# Patient Record
Sex: Male | Born: 1980 | Race: White | Hispanic: No | Marital: Single | Smoking: Current every day smoker
Health system: Southern US, Community
[De-identification: ages and names within clinical notes are randomized; demographics above are authoritative.]

## PROBLEM LIST (undated history)

## (undated) DIAGNOSIS — I4891 Unspecified atrial fibrillation: Secondary | ICD-10-CM

## (undated) DIAGNOSIS — H543 Unqualified visual loss, both eyes: Secondary | ICD-10-CM

## (undated) DIAGNOSIS — I10 Essential (primary) hypertension: Secondary | ICD-10-CM

## (undated) DIAGNOSIS — N289 Disorder of kidney and ureter, unspecified: Secondary | ICD-10-CM

## (undated) HISTORY — PX: DIALYSIS FISTULA CREATION: SHX611

## (undated) HISTORY — PX: EYE SURGERY: SHX253

---

## 2007-03-04 ENCOUNTER — Ambulatory Visit (HOSPITAL_COMMUNITY): Admission: RE | Admit: 2007-03-04 | Discharge: 2007-03-05 | Payer: Self-pay | Admitting: Ophthalmology

## 2007-05-09 ENCOUNTER — Ambulatory Visit (HOSPITAL_COMMUNITY): Admission: RE | Admit: 2007-05-09 | Discharge: 2007-05-10 | Payer: Self-pay | Admitting: Ophthalmology

## 2010-09-19 NOTE — Op Note (Signed)
NAMENAVID, LENZEN                 ACCOUNT NO.:  [id number]   MEDICAL RECORD NO.:  [id number]          PATIENT TYPE:  OIB   LOCATION:  5123                         FACILITY:  [facility]   PHYSICIAN:  Chalmers Guest, M.D.     DATE OF BIRTH:  [birth year]   DATE OF PROCEDURE:  05/09/2007  DATE OF DISCHARGE:                               OPERATIVE REPORT   PREOPERATIVE DIAGNOSIS:  Neovascular glaucoma, right eye.   POSTOPERATIVE DIAGNOSIS:  Neovascular glaucoma with preexisting scar  from previous retinal surgery including pars plana vitrectomy.   ANESTHESIA:  General.   PROCEDURE:  Ahmed tube shunt with mitomycin C with Tutoplast graft.   PROCEDURE IN DETAIL:  After induction of general anesthesia, the  patient's face was prepped and draped in the usual sterile fashion.  Following this, a 6-0 nylon suture was passed through the clear cornea  to infraduct the eye.  It was noted the superior conjunctiva was  scarred, both superonasal and temporal.  A forceps and sharp Westcott  scissors were used to incise the conjunctiva at the limbus.  Blunt  scissor was then used to perform dissection and formation of a fornix  based conjunctival flap.  Bleeding was controlled with cautery. After  the conjunctiva had been recessed approximately 9-10 mm posteriorly and  all bleeders had been controlled, a tenectomy was performed.  The  previous sclerostomy suture was intact and was not removed.  Following  this, the Ahmed was taken from the package and irrigated. It was an  [employer], lot [id number], SN number [id number].  The lens was placed  aside and then mitomycin C was placed on a Gelfoam sponge.  The  mitomycin C was 0.4 mg/mL.  The Gelfoam sponge was placed under the  conjunctiva and on the eye in the fornix for two minutes.  It was then  removed and irrigated with 60 mL of balanced salt solution.  Following  this, the Ahmed plate was then primed after adequate flow of fluid was  seen coming through  the Ahmed plate beyond it was then placed using the  Time forceps and Bishop forceps with the eyelets 8 mm posterior to the  corneal scleral limbus.  A 6-0 Mersilene suture was passed through the  eyelets and it was sutured to the sclera 8 mm posterior to the limbus.  Following this, the tube was trimmed beveled up.  The paracentesis track  was formed at the 9 o'clock position and Provisc was injected in the  eye.  Following this, a 23 gauge needle was used superiorly at the  limbus to enter the anterior chamber.  Additional Provisc was injected.  A Time forceps and the Ahmed tube inserter were then used to insert the  tube into the anterior chamber without touching the cornea or the iris.  Following this, a 9-0 nylon was used to suture the tube to the sclera.  Following this, Tutoplast which was used as an allograft, reference  number [id number], was soaked and then cut in half and then fashioned over  the tube.  Four interrupted 9-0 nylon  sutures were used to suture the  Tutoplast to the sclera.  Following this, the conjunctiva was then  sutured to the cornea using a 6-0 Vicryl suture. After achieving a  watertight closure, BSS was injected in the anterior chamber and the  viscoelastic was burped out of the eye.  The anterior chamber remained  formed.  There was a small amount of blood clot noted around the tube.  Following this,  fluorescein was applied to the incision and it was Seidel negative.  5  mg of Kenalog was injected subconjunctivally at the 6 o'clock position.  Topical TobraDex was applied to the eye.  A patch and Fox shield were  placed and the patient returned to the recovery area in stable  condition.      Chalmers Guest, M.D.  Electronically Signed     RW/MEDQ  D:  05/09/2007  T:  05/09/2007  Job:  [id number]

## 2010-09-19 NOTE — Op Note (Signed)
NAMEJANSEL, VONSTEIN                 ACCOUNT NO.:  [id number]   MEDICAL RECORD NO.:  [id number]          PATIENT TYPE:  OIB   LOCATION:  5729                         FACILITY:  [facility]   PHYSICIAN:  Beulah Gandy. Ashley Royalty, M.D. DATE OF BIRTH:  December 16, 1980   DATE OF PROCEDURE:  03/04/2007  DATE OF DISCHARGE:                               OPERATIVE REPORT   ADMISSION DIAGNOSES:  1. Traction retinal detachment.  2. Proliferative diabetic retinopathy.  3. Posterior epiretinal membranes.  4. Vitreous hemorrhage.  All on the right eye.   PROCEDURES:  1. Pars plana vitrectomy.  2. Repair of traction retinal detachment.  3. Panretinal photocoagulation.  4. Membrane peel.  5. Endodiathermy and endolaser.  All in the right eye.   SURGEON:  Beulah Gandy. Ashley Royalty, M.D.   ASSISTANT:  Bryan Lemma. Lundquist, P.A.   ANESTHESIA:  General.   DETAILS:  Usual prep and drape.  Peritomies at 10, 2 and 4 o'clock, and  infusion at 4 o'clock.  The lighted pick and cutter were placed at 10  and 2 o'clock respectively.  Pars plana vitrectomy was begun in the mid  vitreous, where a young white vitreous was encountered along with clumps  of red blood.  There was severe traction at the vitreous base, as well  as the macular region.  The entire macular region was detached, based on  traction.  The vitrectomy was carried out to the periphery, where the  anterior-posterior traction was relieved with vitreous cutting and MPC  scissors.  Scleral depression was used to gain access to the vitreous  base and trim it.  The vitrectomy was carried posteriorly, where a large  mound of neovascular tissue was causing traction retinal detachment.  This area of adhesion was lifted with the lighted pick, and incised with  MPC scissors.  Endocautery was used to contract and cauterize vascular  tissue.  The tissue was peeled from its attachment to the disk and  peeled off of the macular region.  These inferior and superior arcades  were  peeled and allowed to lie back down.  All areas of traction were  relieved and the retina was allowed to reattach.  The vitrectomy was  carried superiorly, where additional areas of neovascularization were  seen.  These were trimmed, cauterized and removed with the vitreous  cutter.  Once the entire retinal surface was cleaned,  the endolaser was  positioned in the eye.  Then 584 burns were placed around the retinal  periphery at the power of 1000 milliwatts, 1000 microns each and 0.1  seconds each.  A washout procedure was performed.  The instruments were  removed from the eye and 9-0 nylon was used to close the sclerotomy  sites.  The wounds were tested and found to be tight.  The conjunctiva  was closed with wet-field cautery.  Polymyxin and gentamicin were  irrigated into Tenon space.  Atropine solution was applied.  Marcaine  was injected around the globe for postoperative pain.  Decadron 10 mg  was injected into the lower subconjunctival space.  Closing pressure was  15 with  a Barraquer tonometer.   COMPLICATIONS:  None.   DURATION:  1 hour.   TobraDex ophthalmic ointment, a patch and shield were placed.  The  patient was awakened, taken to recovery in satisfactory condition.      Beulah Gandy. Ashley Royalty, M.D.  Electronically Signed     JDM/MEDQ  D:  03/04/2007  T:  03/04/2007  Job:  [id number]

## 2011-02-09 LAB — CBC
HCT: 41.6
MCHC: 35.1
MCV: 84.2

## 2011-02-09 LAB — BASIC METABOLIC PANEL
BUN: 26 — ABNORMAL HIGH
Calcium: 9.1
Chloride: 110
Creatinine, Ser: 2.12 — ABNORMAL HIGH
GFR calc non Af Amer: 38 — ABNORMAL LOW
Glucose, Bld: 66 — ABNORMAL LOW

## 2011-02-14 LAB — CBC
HCT: 39.4
Hemoglobin: 13.7
MCHC: 34.7
RBC: 4.64

## 2011-02-14 LAB — BASIC METABOLIC PANEL
BUN: 21
CO2: 23
Calcium: 8.7
Creatinine, Ser: 2.08 — ABNORMAL HIGH
GFR calc non Af Amer: 39 — ABNORMAL LOW

## 2012-08-16 ENCOUNTER — Inpatient Hospital Stay (HOSPITAL_COMMUNITY)
Admission: EM | Admit: 2012-08-16 | Discharge: 2012-08-18 | DRG: 811 | Disposition: A | Discharge status: Home / Self Care | Payer: Medicare Other | Attending: Internal Medicine | Admitting: Internal Medicine

## 2012-08-16 ENCOUNTER — Encounter (HOSPITAL_COMMUNITY): Payer: Self-pay | Admitting: *Deleted

## 2012-08-16 DIAGNOSIS — Z8249 Family history of ischemic heart disease and other diseases of the circulatory system: Secondary | ICD-10-CM

## 2012-08-16 DIAGNOSIS — N186 End stage renal disease: Secondary | ICD-10-CM | POA: Diagnosis present

## 2012-08-16 DIAGNOSIS — Z833 Family history of diabetes mellitus: Secondary | ICD-10-CM

## 2012-08-16 DIAGNOSIS — E11319 Type 2 diabetes mellitus with unspecified diabetic retinopathy without macular edema: Secondary | ICD-10-CM | POA: Diagnosis present

## 2012-08-16 DIAGNOSIS — I120 Hypertensive chronic kidney disease with stage 5 chronic kidney disease or end stage renal disease: Secondary | ICD-10-CM | POA: Diagnosis present

## 2012-08-16 DIAGNOSIS — Z7682 Awaiting organ transplant status: Secondary | ICD-10-CM

## 2012-08-16 DIAGNOSIS — N2581 Secondary hyperparathyroidism of renal origin: Secondary | ICD-10-CM | POA: Diagnosis present

## 2012-08-16 DIAGNOSIS — Z79899 Other long term (current) drug therapy: Secondary | ICD-10-CM

## 2012-08-16 DIAGNOSIS — E669 Obesity, unspecified: Secondary | ICD-10-CM | POA: Diagnosis present

## 2012-08-16 DIAGNOSIS — H543 Unqualified visual loss, both eyes: Secondary | ICD-10-CM | POA: Diagnosis present

## 2012-08-16 DIAGNOSIS — Z992 Dependence on renal dialysis: Secondary | ICD-10-CM

## 2012-08-16 DIAGNOSIS — Z6835 Body mass index (BMI) 35.0-35.9, adult: Secondary | ICD-10-CM

## 2012-08-16 DIAGNOSIS — E1139 Type 2 diabetes mellitus with other diabetic ophthalmic complication: Secondary | ICD-10-CM | POA: Diagnosis present

## 2012-08-16 DIAGNOSIS — E1149 Type 2 diabetes mellitus with other diabetic neurological complication: Secondary | ICD-10-CM | POA: Diagnosis present

## 2012-08-16 DIAGNOSIS — I10 Essential (primary) hypertension: Secondary | ICD-10-CM

## 2012-08-16 DIAGNOSIS — E872 Acidosis, unspecified: Secondary | ICD-10-CM | POA: Diagnosis present

## 2012-08-16 DIAGNOSIS — R809 Proteinuria, unspecified: Secondary | ICD-10-CM | POA: Diagnosis present

## 2012-08-16 DIAGNOSIS — E119 Type 2 diabetes mellitus without complications: Secondary | ICD-10-CM | POA: Diagnosis present

## 2012-08-16 DIAGNOSIS — E785 Hyperlipidemia, unspecified: Secondary | ICD-10-CM | POA: Diagnosis present

## 2012-08-16 DIAGNOSIS — D631 Anemia in chronic kidney disease: Principal | ICD-10-CM | POA: Diagnosis present

## 2012-08-16 DIAGNOSIS — E1142 Type 2 diabetes mellitus with diabetic polyneuropathy: Secondary | ICD-10-CM | POA: Diagnosis present

## 2012-08-16 DIAGNOSIS — F17200 Nicotine dependence, unspecified, uncomplicated: Secondary | ICD-10-CM | POA: Diagnosis present

## 2012-08-16 DIAGNOSIS — E7800 Pure hypercholesterolemia, unspecified: Secondary | ICD-10-CM | POA: Diagnosis present

## 2012-08-16 DIAGNOSIS — D649 Anemia, unspecified: Secondary | ICD-10-CM | POA: Diagnosis present

## 2012-08-16 HISTORY — DX: Essential (primary) hypertension: I10

## 2012-08-16 HISTORY — DX: Disorder of kidney and ureter, unspecified: N28.9

## 2012-08-16 HISTORY — DX: Unqualified visual loss, both eyes: H54.3

## 2012-08-16 LAB — CBC WITH DIFFERENTIAL/PLATELET
Basophils Absolute: 0 10*3/uL (ref 0.0–0.1)
Basophils Relative: 0 % (ref 0–1)
HCT: 18.9 % — ABNORMAL LOW (ref 39.0–52.0)
Hemoglobin: 6.8 g/dL — CL (ref 13.0–17.0)
Monocytes Absolute: 0.5 10*3/uL (ref 0.1–1.0)
Neutrophils Relative %: 80 % — ABNORMAL HIGH (ref 43–77)
Platelets: 156 10*3/uL (ref 150–400)
RBC: 2.3 MIL/uL — ABNORMAL LOW (ref 4.22–5.81)

## 2012-08-16 LAB — FERRITIN: Ferritin: 304 ng/mL (ref 22–322)

## 2012-08-16 LAB — IRON AND TIBC
Saturation Ratios: 22 % (ref 20–55)
TIBC: 283 ug/dL (ref 215–435)

## 2012-08-16 LAB — COMPREHENSIVE METABOLIC PANEL
BUN: 144 mg/dL — ABNORMAL HIGH (ref 6–23)
Chloride: 96 mEq/L (ref 96–112)
Creatinine, Ser: 16.06 mg/dL — ABNORMAL HIGH (ref 0.50–1.35)
GFR calc Af Amer: 4 mL/min — ABNORMAL LOW (ref >90)
Glucose, Bld: 119 mg/dL — ABNORMAL HIGH (ref 70–99)
Total Bilirubin: 0.1 mg/dL — ABNORMAL LOW (ref 0.3–1.2)

## 2012-08-16 LAB — GLUCOSE, CAPILLARY: Glucose-Capillary: 107 mg/dL — ABNORMAL HIGH (ref 70–99)

## 2012-08-16 LAB — URINE MICROSCOPIC-ADD ON

## 2012-08-16 LAB — URINALYSIS, ROUTINE W REFLEX MICROSCOPIC
Glucose, UA: 100 mg/dL — AB
Ketones, ur: NEGATIVE mg/dL
Protein, ur: 100 mg/dL — AB

## 2012-08-16 LAB — RETICULOCYTES: Retic Ct Pct: 2.5 % (ref 0.4–3.1)

## 2012-08-16 LAB — PROTIME-INR: INR: 0.95 (ref 0.00–1.49)

## 2012-08-16 LAB — HEPATITIS B CORE ANTIBODY, TOTAL: Hep B Core Total Ab: NEGATIVE

## 2012-08-16 LAB — MAGNESIUM: Magnesium: 3 mg/dL — ABNORMAL HIGH (ref 1.5–2.5)

## 2012-08-16 LAB — ALT: ALT: 29 U/L (ref 0–53)

## 2012-08-16 LAB — TSH: TSH: 1.128 u[IU]/mL (ref 0.350–4.500)

## 2012-08-16 LAB — HEPATITIS B CORE ANTIBODY, IGM: Hep B C IgM: NEGATIVE

## 2012-08-16 MED ORDER — SODIUM CHLORIDE 0.9 % IV SOLN: 100.0000 mL | INTRAVENOUS | Status: DC | PRN

## 2012-08-16 MED ORDER — PENTAFLUOROPROP-TETRAFLUOROETH EX AERO
1.0000 "application " | INHALATION_SPRAY | CUTANEOUS | Status: DC | PRN
  Filled 2012-08-16: qty 103.5

## 2012-08-16 MED ORDER — INSULIN ASPART 100 UNIT/ML ~~LOC~~ SOLN
0.0000 [IU] | Freq: Three times a day (TID) | SUBCUTANEOUS | Status: DC
  Administered 2012-08-16 – 2012-08-18 (×3): 1 [IU] via SUBCUTANEOUS

## 2012-08-16 MED ORDER — LORAZEPAM 0.5 MG PO TABS
0.5000 mg | ORAL_TABLET | Freq: Once | ORAL | Status: AC
  Administered 2012-08-16: 0.5 mg via ORAL
  Filled 2012-08-16: qty 1

## 2012-08-16 MED ORDER — LANTHANUM CARBONATE 500 MG PO CHEW
1000.0000 mg | CHEWABLE_TABLET | Freq: Three times a day (TID) | ORAL | Status: DC
  Administered 2012-08-16 – 2012-08-18 (×4): 1000 mg via ORAL
  Filled 2012-08-16 (×14): qty 2

## 2012-08-16 MED ORDER — LIDOCAINE-PRILOCAINE 2.5-2.5 % EX CREA: 1.0000 "application " | TOPICAL_CREAM | CUTANEOUS | Status: DC | PRN

## 2012-08-16 MED ORDER — LORATADINE 10 MG PO TABS
10.0000 mg | ORAL_TABLET | Freq: Every day | ORAL | Status: DC
  Administered 2012-08-18: 10 mg via ORAL
  Filled 2012-08-16 (×3): qty 1

## 2012-08-16 MED ORDER — METOPROLOL SUCCINATE ER 50 MG PO TB24
100.0000 mg | ORAL_TABLET | Freq: Two times a day (BID) | ORAL | Status: DC
  Administered 2012-08-16 – 2012-08-18 (×4): 100 mg via ORAL
  Filled 2012-08-16 (×5): qty 2

## 2012-08-16 MED ORDER — CALCITRIOL 0.25 MCG PO CAPS
0.2500 ug | ORAL_CAPSULE | Freq: Every day | ORAL | Status: DC
  Administered 2012-08-16 – 2012-08-18 (×3): 0.25 ug via ORAL
  Filled 2012-08-16 (×3): qty 1

## 2012-08-16 MED ORDER — HEPARIN SODIUM (PORCINE) 1000 UNIT/ML DIALYSIS
1000.0000 [IU] | INTRAMUSCULAR | Status: DC | PRN
  Filled 2012-08-16: qty 1

## 2012-08-16 MED ORDER — VITAMIN D (ERGOCALCIFEROL) 1.25 MG (50000 UNIT) PO CAPS
50000.0000 [IU] | ORAL_CAPSULE | ORAL | Status: DC
  Administered 2012-08-18: 50000 [IU] via ORAL
  Filled 2012-08-16: qty 1

## 2012-08-16 MED ORDER — ONDANSETRON HCL 4 MG/2ML IJ SOLN
4.0000 mg | INTRAMUSCULAR | Status: DC | PRN
  Filled 2012-08-16: qty 2

## 2012-08-16 MED ORDER — SODIUM POLYSTYRENE SULFONATE 15 GM/60ML PO SUSP
15.0000 g | ORAL | Status: DC
  Administered 2012-08-18: 15 g via ORAL
  Filled 2012-08-16 (×2): qty 60

## 2012-08-16 MED ORDER — SODIUM BICARBONATE 650 MG PO TABS
325.0000 mg | ORAL_TABLET | Freq: Four times a day (QID) | ORAL | Status: DC
  Administered 2012-08-16 – 2012-08-18 (×7): 325 mg via ORAL
  Filled 2012-08-16 (×5): qty 1
  Filled 2012-08-16: qty 2
  Filled 2012-08-16: qty 1

## 2012-08-16 MED ORDER — TRAZODONE HCL 50 MG PO TABS
25.0000 mg | ORAL_TABLET | Freq: Every evening | ORAL | Status: DC | PRN
  Filled 2012-08-16: qty 1

## 2012-08-16 MED ORDER — SIMVASTATIN 20 MG PO TABS
20.0000 mg | ORAL_TABLET | Freq: Every day | ORAL | Status: DC
  Administered 2012-08-16 – 2012-08-17 (×2): 20 mg via ORAL
  Filled 2012-08-16 (×3): qty 1

## 2012-08-16 MED ORDER — SODIUM CHLORIDE 0.9 % IV BOLUS (SEPSIS)
500.0000 mL | Freq: Once | INTRAVENOUS | Status: AC
  Administered 2012-08-16: 500 mL via INTRAVENOUS

## 2012-08-16 MED ORDER — EPOETIN ALFA 10000 UNIT/ML IJ SOLN
10000.0000 [IU] | INTRAMUSCULAR | Status: DC
  Filled 2012-08-16: qty 1

## 2012-08-16 MED ORDER — ACETAMINOPHEN 325 MG PO TABS
650.0000 mg | ORAL_TABLET | Freq: Once | ORAL | Status: DC
  Filled 2012-08-16: qty 2

## 2012-08-16 MED ORDER — GUAIFENESIN ER 600 MG PO TB12
1200.0000 mg | ORAL_TABLET | Freq: Two times a day (BID) | ORAL | Status: DC | PRN
  Filled 2012-08-16: qty 2

## 2012-08-16 MED ORDER — FLUTICASONE PROPIONATE 50 MCG/ACT NA SUSP
2.0000 | Freq: Every day | NASAL | Status: DC
  Filled 2012-08-16: qty 16

## 2012-08-16 MED ORDER — LISINOPRIL 10 MG PO TABS
40.0000 mg | ORAL_TABLET | Freq: Every day | ORAL | Status: DC
  Administered 2012-08-16 – 2012-08-18 (×3): 40 mg via ORAL
  Filled 2012-08-16 (×3): qty 4

## 2012-08-16 MED ORDER — LOSARTAN POTASSIUM 50 MG PO TABS
100.0000 mg | ORAL_TABLET | Freq: Every day | ORAL | Status: DC
  Administered 2012-08-16 – 2012-08-17 (×2): 100 mg via ORAL
  Filled 2012-08-16 (×3): qty 2

## 2012-08-16 MED ORDER — NEPRO/CARBSTEADY PO LIQD
237.0000 mL | ORAL | Status: DC | PRN
  Filled 2012-08-16: qty 237

## 2012-08-16 MED ORDER — SODIUM CHLORIDE 0.9 % IJ SOLN
3.0000 mL | Freq: Two times a day (BID) | INTRAMUSCULAR | Status: DC
  Administered 2012-08-16 – 2012-08-17 (×4): 3 mL via INTRAVENOUS

## 2012-08-16 MED ORDER — ALTEPLASE 2 MG IJ SOLR
2.0000 mg | Freq: Once | INTRAMUSCULAR | Status: DC | PRN
  Filled 2012-08-16: qty 2

## 2012-08-16 MED ORDER — HYDRALAZINE HCL 25 MG PO TABS
50.0000 mg | ORAL_TABLET | Freq: Two times a day (BID) | ORAL | Status: DC
  Administered 2012-08-16 – 2012-08-18 (×5): 50 mg via ORAL
  Filled 2012-08-16 (×5): qty 2

## 2012-08-16 MED ORDER — HEPARIN SODIUM (PORCINE) 5000 UNIT/ML IJ SOLN
5000.0000 [IU] | Freq: Three times a day (TID) | INTRAMUSCULAR | Status: DC
  Administered 2012-08-16 – 2012-08-18 (×7): 5000 [IU] via SUBCUTANEOUS
  Filled 2012-08-16 (×7): qty 1

## 2012-08-16 MED ORDER — LIDOCAINE HCL (PF) 1 % IJ SOLN
5.0000 mL | INTRAMUSCULAR | Status: DC | PRN
  Filled 2012-08-16: qty 5

## 2012-08-16 MED ORDER — INSULIN ASPART 100 UNIT/ML ~~LOC~~ SOLN: 0.0000 [IU] | Freq: Every day | SUBCUTANEOUS | Status: DC

## 2012-08-16 MED ORDER — AMLODIPINE BESYLATE 5 MG PO TABS
5.0000 mg | ORAL_TABLET | Freq: Two times a day (BID) | ORAL | Status: DC
  Administered 2012-08-16 – 2012-08-18 (×5): 5 mg via ORAL
  Filled 2012-08-16 (×5): qty 1

## 2012-08-16 NOTE — H&P (Signed)
[facility] History and Physical  Justin Ortega  ZOX:[id number]  DOB: 21-May-1980   DOA: 08/16/2012  Late Documentation: Admission H&P completed at 6AM   PCP:   Josue Hector, MD  Nephrologist: Dr. Fausto Skillern  Chief Complaint:  Abnormal lab results  HPI: Justin Ortega is an 32 y.o. male.  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 Fausto Skillern but defaulted after December when his Creatinine was found to be >8. He returned for follow up yesterday and last night was called by his nephrologist and told to go to the ED for abnormal labs.  In the ED his Hb was 6.8, BUN 144, Creat 16, K 4.9, CO2 16.  He reports progressive fatigue over the past weeks, and is unable to state clearly why he has not been seen since December, but does report that his father "Was killed by dialysis, he had a heart attack on dialysis" He denies ever having had a biopsy and denies knowledge of the cause of the kidney failure, neither his nor his father's.  He no longer needs medication to control his blood sugar, and he seesm to believe thi means he nolonger has diabetes.  Although both parents had DM, HTN and ESRD, he has 2 sisters reportedly in perfect health; he has no brothers.  In order for him to get the transplant his girlfriend, who is the kidney donor, needs to lose another 10lbs, and he needs to quit smoking.    Apart from fatigue is only acute symptoms aresinus congestion a persistrent cough for 4 days.  He reports adequate urination and frequency, Day/Night 5-6/2-3  He has a mature dialysis fistula left upper extremity.  Rewiew of Systems:   All systems negative except as marked bold or noted in the HPI;  Constitutional:    malaise, fever and chills. ;  Eyes:   eye pain, redness and discharge. ; blind x 6 years ENMT:   ear pain, hoarseness,  nasal congestion, sinus pressure and sore throat. ;  Cardiovascular:    chest pain, palpitations, diaphoresis, dyspnea and peripheral edema.  Respiratory:   cough, hemoptysis, wheezing and stridor. ;  Gastrointestinal:  nausea, vomiting, diarrhea, constipation, abdominal pain, melena, blood in stool, hematemesis, jaundice and rectal bleeding. unusual weight loss..   Genitourinary:    frequency, dysuria, incontinence,flank pain and hematuria; Musculoskeletal:   back pain and neck pain.  swelling and trauma.;  Skin: .  pruritus, rash, abrasions, bruising and skin lesion.; ulcerations Neuro:    headache, lightheadedness and neck stiffness.  weakness, altered level of consciousness, altered mental status, extremity weakness, burning feet, involuntary movement, seizure and syncope.  Psych:    anxiety, depression, insomnia, tearfulness, panic attacks, hallucinations, paranoia, suicidal or homicidal ideation    Past Medical History  Diagnosis Date  . Renal disorder   . Blind in both eyes   . Hypertension     Past Surgical History  Procedure Laterality Date  . Eye surgery    . Dialysis fistula creation      Medications:  HOME MEDS: Prior to Admission medications   Medication Sig Start Date End Date Taking? Authorizing Provider  acetaminophen (TYLENOL) 500 MG tablet Take 500 mg by mouth every 6 (six) hours as needed for pain.   Yes Historical Provider, MD  amLODipine (NORVASC) 5 MG tablet Take 5 mg by mouth 2 (two) times daily.  Yes Historical Provider, MD  calcitRIOL (ROCALTROL) 0.25 MCG capsule Take 0.25 mcg by mouth daily.   Yes Historical Provider, MD  diphenhydrAMINE (BENADRYL) 25 mg capsule Take 25 mg by mouth every 6 (six) hours as needed for allergies.   Yes Historical Provider, MD  hydrALAZINE (APRESOLINE) 50 MG tablet Take 50 mg by mouth 2 (two) times daily.   Yes Historical Provider, MD  lanthanum (FOSRENOL) 500 MG chewable tablet Chew 1,000 mg by mouth. Take 2 tabs w/ every meal  & 1 w/ every snack.   Yes Historical Provider, MD  lisinopril (PRINIVIL,ZESTRIL) 40 MG tablet Take 40 mg by mouth daily.   Yes Historical Provider, MD  losartan (COZAAR) 100 MG tablet Take 100 mg by mouth daily.   Yes Historical Provider, MD  metoprolol succinate (TOPROL-XL) 100 MG 24 hr tablet Take 100 mg by mouth 2 (two) times daily. Take with or immediately following a meal.   Yes Historical Provider, MD  pravastatin (PRAVACHOL) 40 MG tablet Take 40 mg by mouth daily.   Yes Historical Provider, MD  sodium bicarbonate 325 MG tablet Take by mouth 4 (four) times daily. 10gr tab   Yes Historical Provider, MD  sodium bicarbonate 650 MG tablet Take 650 mg by mouth 2 (two) times daily.   Yes Historical Provider, MD  sodium polystyrene (KAYEXALATE) 15 GM/60ML suspension Take 15 g by mouth 2 (two) times a week.   Yes Historical Provider, MD  Vitamin D, Ergocalciferol, (DRISDOL) 50000 UNITS CAPS Take 50,000 Units by mouth 2 (two) times a week.   Yes Historical Provider, MD     Allergies:  Allergies  Allergen Reactions  . Bystolic (Nebivolol Hcl) Palpitations    Social History:   reports that he has been smoking Cigarettes.  He has been smoking about 0.00 packs per day. He does not have any smokeless tobacco history on file. He reports that he does not drink alcohol or use illicit drugs.  Family History: Family History  Problem Relation Age of Onset  . Diabetes Mother   . Hypertension Mother   . Diabetes Father   . Hypertension Father   . Heart disease Mother     Died ([facility]) on dialysis  . Heart disease Father   . Kidney failure Father   . Kidney failure Mother      Physical Exam: Filed Vitals:   08/16/12 1645 08/16/12 1700 08/16/12 1722 08/16/12 1735  BP: 155/80 149/69 157/67 163/72  Pulse: 83 83 81 81  Temp:    98 F (36.7 C)  TempSrc:    Oral  Resp:    20  Height:      Weight:      SpO2:    95%   Blood pressure 163/72, pulse 81, temperature 98 F (36.7 C), temperature  source Oral, resp. rate 20, height 5\' 8"  (1.727 m), weight 105.6 kg (232 lb 12.9 oz), SpO2 95.00%.  GEN:  Pleasant obese young man, in no acute distress; cooperative with exam; coughing frequently; keeps eyes closed. PSYCH:  alert and oriented x4;  anxious or depressed; affect is appropriate. HEENT: Mucous membranes pink and anicteric; opaque dilated right pupil; widley dilated left pupil; non-reativeRRLA; EOM intact; no cervical lymphadenopathy nor thyromegaly or carotid bruit; no JVD; Breasts:: Not examined CHEST WALL: No tenderness CHEST: Normal respiration, clear to auscultation bilaterally HEART: Regular rate and rhythm; no murmurs rubs or gallops BACK: mild kyphosis no scoliosis; no CVA tenderness ABDOMEN: Obese, soft non-tender; no masses, no organomegaly, normal abdominal bowel  sounds; moderate pannus; no intertriginous candida. Rectal Exam: Not done EXTREMITIES: No bone or joint deformity;  no edema; no ulcerations. Genitalia: not examined PULSES: 2+ and symmetric SKIN: Normal hydration no rash or ulceration CNS: Cranial nerves 3-12 grossly intact no focal lateralizing neurologic deficit   Labs on Admission:  Basic Metabolic Panel:  Recent Labs Lab 08/16/12 0301 08/16/12 0330  NA 134*  --   K 4.9  --   CL 96  --   CO2 16*  --   GLUCOSE 119*  --   BUN 144*  --   CREATININE 16.06*  --   CALCIUM 9.4  --   MG  --  3.0*   Liver Function Tests:  Recent Labs Lab 08/16/12 0301 08/16/12 1008  AST 13  --   ALT 30 29  ALKPHOS 65  --   BILITOT 0.1*  --   PROT 6.9  --   ALBUMIN 3.7  --    No results found for this basename: LIPASE, AMYLASE,  in the last 168 hours No results found for this basename: AMMONIA,  in the last 168 hours CBC:  Recent Labs Lab 08/16/12 0301  WBC 8.4  NEUTROABS 6.7  HGB 6.8*  HCT 18.9*  MCV 82.2  PLT 156   Cardiac Enzymes: No results found for this basename: CKTOTAL, CKMB, CKMBINDEX, TROPONINI,  in the last 168 hours BNP: No  components found with this basename: POCBNP,  D-dimer: No components found with this basename: D-DIMER,  CBG:  Recent Labs Lab 08/16/12 0733 08/16/12 1220 08/16/12 1821  GLUCAP 121* 108* 107*    Radiological Exams on Admission: No results found.    Assessment/Plan Present on Admission:  . ESRD needing dialysis . Anemia . HTN (hypertension) . Diabetes type 2, controlled Tobacco abuse (smokes 2-4/day)   PLAN: Admit to telemetry and prepare 2u PRBC to transfuse at dialysis. Consult his nephrologist for assistance Check HbAic, TSH Renal diet and prn SSI encourage weight loss and nicotine cessation for maximum benefit from renal transplant. Reassure of the safety of dialysis with a good mental attitude and properattention to keeping oneself healthy.   Other plans as per orders.  Code Status: Full Family Communication: discussed plans with pt; girlfriend at bedside. Disposition Plan: likely home after dialysis.    Justin Ortega Nocturnist [facility] Pager [phone]   08/16/2012, 6:56 PM

## 2012-08-16 NOTE — Progress Notes (Signed)
Requested he not be disturbed tonight. He and his Girlfriend needed sleep. Obtained his vitals. Informed pt we would still round on him every 2 hours after 12. To call if he needed anything.

## 2012-08-16 NOTE — Procedures (Signed)
2 hour FIRST EVER hemodialysis treatment completed through left upper arm AVF (17g needles / antegrade position).  GOAL MET:  Tolerated removal of 0.6 liters with no interruption in ultrafiltration.  Extensive teaching about dialysis process and what to expect as outpatient provided to patient and family prior to obtaining initial consent for treatment.  All blood was reinfused.  Hemostasis was achieved within 10 minutes.  Report given to Scotty Court, RN.

## 2012-08-16 NOTE — Progress Notes (Signed)
Patient admitted after midnight. Notes reviewed. Discussed with Dr. Kristian Covey. He would like to hold off on transfusion for now, as patient is not very symptomatic.  Patient interviewed and examined. For dialysis today.

## 2012-08-16 NOTE — ED Provider Notes (Signed)
History     CSN: [id number]  Arrival date & time 08/16/12  0159   First MD Initiated Contact with Patient 08/16/12 [phone]      Chief Complaint  Patient presents with  . sent by DR for transfusion     (Consider location/radiation/quality/duration/timing/severity/associated sxs/prior treatment) HPI Comments: Patient with history of ESRD, sent to the ED by Dr. Kristian Covey for possible blood transfusion.  Patient is close to starting dialysis, had labs drawn today that showed Hb of 6.1.  He denies to me he is having any chest pain, shortness of breath or other complaints.  He denies feeling dizzy when standing.    The history is provided by the patient.    Past Medical History  Diagnosis Date  . Renal disorder   . Blind in both eyes   . Hypertension     Past Surgical History  Procedure Laterality Date  . Eye surgery    . Dialysis fistula creation      History reviewed. No pertinent family history.  History  Substance Use Topics  . Smoking status: Current Every Day Smoker    Types: Cigarettes  . Smokeless tobacco: Not on file  . Alcohol Use: No      Review of Systems  All other systems reviewed and are negative.    Allergies  Bystolic  Home Medications  No current outpatient prescriptions on file.  BP 167/77  Pulse 82  Temp(Src) 98.4 F (36.9 C) (Oral)  Resp 20  Ht 5\' 8"  (1.727 m)  Wt 232 lb 12.9 oz (105.6 kg)  BMI 35.41 kg/m2  SpO2 97%  Physical Exam  Nursing note and vitals reviewed. Constitutional: He is oriented to person, place, and time.  Patient is pale-appearing, no acute distress.  HENT:  Head: Normocephalic and atraumatic.  Eyes: EOM are normal. Pupils are equal, round, and reactive to light.  Neck: Normal range of motion. Neck supple.  Cardiovascular: Normal rate and regular rhythm.   No murmur heard. Pulmonary/Chest: Effort normal and breath sounds normal. No respiratory distress.  Abdominal: Soft. Bowel sounds are normal. He exhibits no  distension. There is no tenderness.  Musculoskeletal: Normal range of motion. He exhibits no edema.  Neurological: He is alert and oriented to person, place, and time. No cranial nerve deficit.  Skin: Skin is warm and dry. No erythema.    ED Course  Procedures (including critical care time)  Labs Reviewed  CBC WITH DIFFERENTIAL - Abnormal; Notable for the following:    RBC 2.30 (*)    Hemoglobin 6.8 (*)    HCT 18.9 (*)    Neutrophils Relative 80 (*)    All other components within normal limits  COMPREHENSIVE METABOLIC PANEL - Abnormal; Notable for the following:    Sodium 134 (*)    CO2 16 (*)    Glucose, Bld 119 (*)    BUN 144 (*)    Creatinine, Ser 16.06 (*)    Total Bilirubin 0.1 (*)    GFR calc non Af Amer 3 (*)    GFR calc Af Amer 4 (*)    All other components within normal limits  TYPE AND SCREEN  ABO/RH   No results found.   1. ESRD needing dialysis   2. Anemia      MDM  Labs show a Hb of 6.8, Cr to 16 with bun 144 and metabolic acidosis.  I have spoken with Dr. Orvan Falconer who agrees to admit the patient to the telemetry floor.  He will  likely require dialysis in the very near future, perhaps during this hospitalization.        Sudie Grumbling, MD 08/16/12 [phone]

## 2012-08-16 NOTE — Consult Note (Signed)
Reason for Consult: End-stage renal disease and anemia Referring Physician: Dr. Alphia Kava is an 32 y.o. male.  HPI: He the patient was history of diabetes, diabetic retinopathy and chronic renal failure end-stage. Patient was being followed at my office however since the patient was being given with kidney transplant refused to come for blood work on followup. After missing a couple of appointment we called him yesterday and discussed with the patient and his father at least to have a blood work. When the blood work was done his BUN was found to be 144 and creatinine 16. Patient with severe anemia. Because of that patient was urged to come to the emergency room where he's admitted to the hospital. Patient states that he would like to avoid dialysis by all means and that's why he didn't want to come to the office for followup. Presently have discussed with him that patient need to be on dialysis and he is agreeable. He denies any difficulty in breathing. He has lost some weight and he said he doesn't have any nausea or vomiting but her appetite seems to be getting poorer.  Past Medical History  Diagnosis Date  . Renal disorder   . Blind in both eyes   . Hypertension     Past Surgical History  Procedure Laterality Date  . Eye surgery    . Dialysis fistula creation      Family History  Problem Relation Age of Onset  . Diabetes Mother   . Hypertension Mother   . Diabetes Father   . Hypertension Father   . Heart disease Mother     Died ([facility]) on dialysis  . Heart disease Father   . Kidney failure Father   . Kidney failure Mother     Social History:  reports that he has been smoking Cigarettes.  He has been smoking about 0.00 packs per day. He does not have any smokeless tobacco history on file. He reports that he does not drink alcohol or use illicit drugs.  Allergies:  Allergies  Allergen Reactions  . Bystolic (Nebivolol Hcl) Palpitations    Medications: I have  reviewed the patient's current medications.  Results for orders placed during the hospital encounter of 08/16/12 (from the past 48 hour(s))  CBC WITH DIFFERENTIAL     Status: Abnormal   Collection Time    08/16/12  3:01 AM      Result Value Range   WBC 8.4  4.0 - 10.5 K/uL   RBC 2.30 (*) 4.22 - 5.81 MIL/uL   Hemoglobin 6.8 (*) 13.0 - 17.0 g/dL   Comment: RESULT REPEATED AND VERIFIED     CRITICAL RESULT CALLED TO, READ BACK BY AND VERIFIED WITH:     WILLIAMS,P @ 4098 ON 08/16/12/ BY WOODIE,J   HCT 18.9 (*) 39.0 - 52.0 %   MCV 82.2  78.0 - 100.0 fL   MCH 29.6  26.0 - 34.0 pg   MCHC 36.0  30.0 - 36.0 g/dL   RDW 11.9  14.7 - 82.9 %   Platelets 156  150 - 400 K/uL   Neutrophils Relative 80 (*) 43 - 77 %   Neutro Abs 6.7  1.7 - 7.7 K/uL   Lymphocytes Relative 12  12 - 46 %   Lymphs Abs 1.0  0.7 - 4.0 K/uL   Monocytes Relative 6  3 - 12 %   Monocytes Absolute 0.5  0.1 - 1.0 K/uL   Eosinophils Relative 2  0 - 5 %  Eosinophils Absolute 0.2  0.0 - 0.7 K/uL   Basophils Relative 0  0 - 1 %   Basophils Absolute 0.0  0.0 - 0.1 K/uL  COMPREHENSIVE METABOLIC PANEL     Status: Abnormal   Collection Time    08/16/12  3:01 AM      Result Value Range   Sodium 134 (*) 135 - 145 mEq/L   Potassium 4.9  3.5 - 5.1 mEq/L   Chloride 96  96 - 112 mEq/L   CO2 16 (*) 19 - 32 mEq/L   Glucose, Bld 119 (*) 70 - 99 mg/dL   BUN 119 (*) 6 - 23 mg/dL   Creatinine, Ser 14.78 (*) 0.50 - 1.35 mg/dL   Calcium 9.4  8.4 - 29.5 mg/dL   Total Protein 6.9  6.0 - 8.3 g/dL   Albumin 3.7  3.5 - 5.2 g/dL   AST 13  0 - 37 U/L   ALT 30  0 - 53 U/L   Alkaline Phosphatase 65  39 - 117 U/L   Total Bilirubin 0.1 (*) 0.3 - 1.2 mg/dL   GFR calc non Af Amer 3 (*) >90 mL/min   GFR calc Af Amer 4 (*) >90 mL/min   Comment:            The eGFR has been calculated     using the CKD EPI equation.     This calculation has not been     validated in all clinical     situations.     eGFR's persistently     <90 mL/min signify      possible Chronic Kidney Disease.  TYPE AND SCREEN     Status: None   Collection Time    08/16/12  3:25 AM      Result Value Range   ABO/RH(D) A POS     Antibody Screen NEG     Sample Expiration 08/19/2012     Unit Number [id number]     Blood Component Type RED CELLS,LR     Unit division 00     Status of Unit ALLOCATED     Transfusion Status OK TO TRANSFUSE     Crossmatch Result Compatible     Unit Number [id number]     Blood Component Type RED CELLS,LR     Unit division 00     Status of Unit ALLOCATED     Transfusion Status OK TO TRANSFUSE     Crossmatch Result Compatible    ABO/RH     Status: None   Collection Time    08/16/12  3:25 AM      Result Value Range   ABO/RH(D) A POS    MAGNESIUM     Status: Abnormal   Collection Time    08/16/12  3:30 AM      Result Value Range   Magnesium 3.0 (*) 1.5 - 2.5 mg/dL  APTT     Status: None   Collection Time    08/16/12  3:30 AM      Result Value Range   aPTT 34  24 - 37 seconds  PROTIME-INR     Status: None   Collection Time    08/16/12  3:30 AM      Result Value Range   Prothrombin Time 12.6  11.6 - 15.2 seconds   INR 0.95  0.00 - 1.49  RETICULOCYTES     Status: Abnormal   Collection Time    08/16/12  3:30 AM  Result Value Range   Retic Ct Pct 2.5  0.4 - 3.1 %   RBC. 2.27 (*) 4.22 - 5.81 MIL/uL   Retic Count, Manual 56.8  19.0 - 186.0 K/uL  GLUCOSE, CAPILLARY     Status: Abnormal   Collection Time    08/16/12  7:33 AM      Result Value Range   Glucose-Capillary 121 (*) 70 - 99 mg/dL  PREPARE RBC (CROSSMATCH)     Status: None   Collection Time    08/16/12  7:48 AM      Result Value Range   Order Confirmation ORDER PROCESSED BY BLOOD BANK      No results found.  Review of Systems  Constitutional: Negative for weight loss.  Respiratory: Negative for shortness of breath.   Cardiovascular: Negative for orthopnea and PND.  Gastrointestinal: Negative for nausea and vomiting.  Neurological: Positive for  weakness.   Blood pressure 167/77, pulse 82, temperature 98.4 F (36.9 C), temperature source Oral, resp. rate 20, height 5\' 8"  (1.727 m), weight 105.6 kg (232 lb 12.9 oz), SpO2 97.00%. Physical Exam  Eyes: No scleral icterus.  Neck: No JVD present.  Cardiovascular: Normal rate and regular rhythm.   No murmur heard. Respiratory: He has no wheezes.  GI: There is no tenderness. There is no rebound.  Musculoskeletal: He exhibits no edema.    Assessment/Plan: Problem #1 chronic renal failure presently reaching end-stage. His BUN and creatinine has significantly increased. Problem #2 hypertension his blood pressure is not well controlled Problem #3 history of diabetes Problem #4 anemia most likely secondary to chronic renal failure. History of and hematocrit has declined significantly. Problem #5 metabolic bone disease his phosphorus is 9.6. Patient was supposed to be on a binder but presently he has run out of his binders and is not taking a regular basis. Problem #6 diabetic neuropathy Problem #7 secondary hyperparathyroidism patient was supposed to be on calcitriol. Plan: We'll make arrangements for patient to get dialysis today We'll put PPD, we'll check hepatitis B surface antigen, hepatitis B. surface antibody and hepatitis B core antibody. We'll check a CBC and basic metabolic panel in the morning and make arrangement for dialysis in the morning again.  Justin Ortega S 08/16/2012, 9:15 AM

## 2012-08-16 NOTE — ED Notes (Signed)
Pt was called tonight by Dr Jeanie Cooks tonight & was told to come to ER for blood transfusing pt states was told his hemoglobin 6.1

## 2012-08-16 NOTE — ED Notes (Signed)
CRITICAL VALUE ALERT  Critical value received:  Hemoglobin 6.8  Date of notification:  08/16/12  Time of notification: 0 333  Critical value read back:yes  Nurse who received alert:  Aileen Pilot  MD notified (1st page):  delo  Time of first page:    MD notified (2nd page):  Time of second page:  Responding MD:  Judd Lien  Time MD responded:  [phone]

## 2012-08-17 LAB — CBC
HCT: 18.4 % — ABNORMAL LOW (ref 39.0–52.0)
Hemoglobin: 6.5 g/dL — CL (ref 13.0–17.0)
MCHC: 35.3 g/dL (ref 30.0–36.0)
MCV: 83.6 fL (ref 78.0–100.0)

## 2012-08-17 LAB — PHOSPHORUS: Phosphorus: 7.4 mg/dL — ABNORMAL HIGH (ref 2.3–4.6)

## 2012-08-17 LAB — COMPREHENSIVE METABOLIC PANEL
Alkaline Phosphatase: 60 U/L (ref 39–117)
BUN: 98 mg/dL — ABNORMAL HIGH (ref 6–23)
GFR calc Af Amer: 5 mL/min — ABNORMAL LOW (ref >90)
Glucose, Bld: 112 mg/dL — ABNORMAL HIGH (ref 70–99)
Potassium: 4.5 mEq/L (ref 3.5–5.1)
Total Bilirubin: 0.1 mg/dL — ABNORMAL LOW (ref 0.3–1.2)
Total Protein: 6.5 g/dL (ref 6.0–8.3)

## 2012-08-17 LAB — GLUCOSE, CAPILLARY
Glucose-Capillary: 95 mg/dL (ref 70–99)
Glucose-Capillary: 148 mg/dL — ABNORMAL HIGH (ref 70–99)
Glucose-Capillary: <10 mg/dL — CL (ref 70–99)

## 2012-08-17 MED ORDER — SODIUM CHLORIDE 0.9 % IV SOLN: 100.0000 mL | INTRAVENOUS | Status: DC | PRN

## 2012-08-17 MED ORDER — PENTAFLUOROPROP-TETRAFLUOROETH EX AERO
1.0000 "application " | INHALATION_SPRAY | CUTANEOUS | Status: DC | PRN
  Filled 2012-08-17: qty 103.5

## 2012-08-17 MED ORDER — NEPRO/CARBSTEADY PO LIQD
237.0000 mL | ORAL | Status: DC | PRN
  Filled 2012-08-17: qty 237

## 2012-08-17 MED ORDER — LORAZEPAM 0.5 MG PO TABS
0.5000 mg | ORAL_TABLET | ORAL | Status: AC
  Administered 2012-08-17: 0.5 mg via ORAL

## 2012-08-17 MED ORDER — ALTEPLASE 2 MG IJ SOLR
2.0000 mg | Freq: Once | INTRAMUSCULAR | Status: DC | PRN
  Filled 2012-08-17: qty 2

## 2012-08-17 MED ORDER — HEPARIN SODIUM (PORCINE) 1000 UNIT/ML DIALYSIS
1000.0000 [IU] | INTRAMUSCULAR | Status: DC | PRN
  Filled 2012-08-17: qty 1

## 2012-08-17 MED ORDER — LIDOCAINE-PRILOCAINE 2.5-2.5 % EX CREA: 1.0000 "application " | TOPICAL_CREAM | CUTANEOUS | Status: DC | PRN

## 2012-08-17 MED ORDER — EPOETIN ALFA 10000 UNIT/ML IJ SOLN
14000.0000 [IU] | INTRAMUSCULAR | Status: DC
  Filled 2012-08-17: qty 2

## 2012-08-17 MED ORDER — LIDOCAINE HCL (PF) 1 % IJ SOLN
5.0000 mL | INTRAMUSCULAR | Status: DC | PRN
  Filled 2012-08-17: qty 5

## 2012-08-17 NOTE — Progress Notes (Signed)
Subjective: Interval History: has no complaint of nausea or vomiting. Patient says that he's feeling better. Patient denies any difficulty in breathing..  Objective: Vital signs in last 24 hours: Temp:  [97.8 F (36.6 C)-98.3 F (36.8 C)] 98.2 F (36.8 C) (04/13 8295) Pulse Rate:  [80-90] 83 (04/13 0614) Resp:  [16-20] 20 (04/13 0614) BP: (146-183)/(64-91) 168/91 mmHg (04/13 0614) SpO2:  [90 %-96 %] 90 % (04/13 0614) Weight:  [103.7 kg (228 lb 9.9 oz)-105.6 kg (232 lb 12.9 oz)] 103.7 kg (228 lb 9.9 oz) (04/13 6213) Weight change: 1.273 kg (2 lb 12.9 oz)  Intake/Output from previous day: 04/12 0701 - 04/13 0700 In: -  Out: 803 [Urine:200] Intake/Output this shift:    General appearance: alert, cooperative and no distress Resp: clear to auscultation bilaterally Cardio: regular rate and rhythm, S1, S2 normal, no murmur, click, rub or gallop GI: soft, non-tender; bowel sounds normal; no masses,  no organomegaly Extremities: extremities normal, atraumatic, no cyanosis or edema  Lab Results:  Recent Labs  08/16/12 0301 08/17/12 0615  WBC 8.4 6.9  HGB 6.8* 6.5*  HCT 18.9* 18.4*  PLT 156 136*   BMET:  Recent Labs  08/16/12 0301 08/17/12 0615  NA 134* 138  K 4.9 4.5  CL 96 101  CO2 16* 21  GLUCOSE 119* 112*  BUN 144* 98*  CREATININE 16.06* 12.85*  CALCIUM 9.4 9.0   No results found for this basename: PTH,  in the last 72 hours Iron Studies:  Recent Labs  08/16/12 0330  IRON 61  TIBC 283  FERRITIN 304    Studies/Results: No results found.  I have reviewed the patient's current medications.  Assessment/Plan: Problem #1 end-stage renal disease he status post short dialysis yesterday BUN is 98 and creatinine is 12.85 has improved. Presently patient denies any nausea vomiting.  Problem #2 anemia his airway 6.5 hematocrit is 18.4 presently declining. Most likely secondary to chronic renal failure. Problem #3 hypertension his blood pressure seems reasonably  controlled Problem #4 metabolic bone disease patient on a binder phosphorus high but improving. Problem #5 diabetes Problem #6 hypercholesterolemia he is on Zocor Problem #7 proteinuria. Plan: Will dialyze patient 4 hours today Because of continuous declining in his hemoglobin hematocrit we'll transfuse patient possibly on dialysis. We'll check his basic metabolic panel and CBC in the morning. We'll try to make arrangement for outpatient dialysis in [facility] clinic for outpatient dialysis. Once up arrangements done patient could be discharged to be followed as an outpatient.   LOS: 1 day   Lachanda Buczek S 08/17/2012,8:40 AM

## 2012-08-17 NOTE — Progress Notes (Signed)
[facility] PROGRESS NOTE  KAGAN MUTCHLER JYN:[id number] DOB: 09-18-1980 DOA: 08/16/2012 PCP: Josue Hector, MD  Assessment/Plan: Active Problems:   ESRD needing dialysis: Status post secondary dialysis. Appreciate nephrology help. We'll set up as outpatient.   Anemia: Secondary to chronic renal disease. Recheck in the morning. Will likely need to start on Epogen, defer to nephrology   HTN (hypertension): Blood pressures stable   Diabetes type 2, controlled: Sugars remained stable. Hyperlipidemia-continue statin   Code Status: Full Family Communication: Plan discussed with patient at bedside Disposition Plan: Home tomorrow, we'll set up dialysis plan first   Consultants:  Nephrology-[place]  Procedures:  Status post hemodialysis on 4/12,/13  Antibiotics:  None  HPI/Subjective: Patient feeling much better. No complaints.  Objective: Filed Vitals:   08/17/12 1400 08/17/12 1430 08/17/12 1500 08/17/12 1510  BP: 161/81 159/84 151/80 172/79  Pulse: 82 99 89 81  Temp:    97.9 F (36.6 C)  TempSrc:    Oral  Resp:    20  Height:      Weight:    100.8 kg (222 lb 3.6 oz)  SpO2:    96%    Intake/Output Summary (Last 24 hours) at 08/17/12 1744 Last data filed at 08/17/12 1510  Gross per 24 hour  Intake      0 ml  Output   2300 ml  Net  -2300 ml   Filed Weights   08/17/12 0614 08/17/12 1040 08/17/12 1510  Weight: 103.7 kg (228 lb 9.9 oz) 103.7 kg (228 lb 9.9 oz) 100.8 kg (222 lb 3.6 oz)    Exam:   General:  Alert and oriented x3, no acute distress  Cardiovascular: Regular rate and rhythm, S1-S2  Respiratory: Clear to auscultation bilaterally  Abdomen: Soft, obese, nontender, positive bowel sounds  Musculoskeletal: No clubbing or cyanosis, trace edema  Data Reviewed: Basic Metabolic Panel:  Recent Labs Lab 08/16/12 0301 08/16/12 0330 08/17/12 0615  NA 134*  --  138  K 4.9  --  4.5  CL 96  --  101  CO2 16*  --  21  GLUCOSE 119*  --   112*  BUN 144*  --  98*  CREATININE 16.06*  --  12.85*  CALCIUM 9.4  --  9.0  MG  --  3.0*  --   PHOS  --   --  7.4*   Liver Function Tests:  Recent Labs Lab 08/16/12 0301 08/16/12 1008 08/17/12 0615  AST 13  --  11  ALT 30 29 26   ALKPHOS 65  --  60  BILITOT 0.1*  --  0.1*  PROT 6.9  --  6.5  ALBUMIN 3.7  --  3.4*   CBC:  Recent Labs Lab 08/16/12 0301 08/17/12 0615  WBC 8.4 6.9  NEUTROABS 6.7  --   HGB 6.8* 6.5*  HCT 18.9* 18.4*  MCV 82.2 83.6  PLT 156 136*   CBG:  Recent Labs Lab 08/16/12 1220 08/16/12 1821 08/16/12 2125 08/17/12 0819 08/17/12 1618  GLUCAP 108* 107* 173* 121* 95      Studies: No results found.  Scheduled Meds: . acetaminophen  650 mg Oral Once  . amLODipine  5 mg Oral BID  . calcitRIOL  0.25 mcg Oral Daily  . [START ON 08/18/2012] epoetin alfa  14,000 Units Intravenous 3 times weekly  . fluticasone  2 spray Each Nare Daily  . heparin  5,000 Units Subcutaneous Q8H  . hydrALAZINE  50 mg Oral BID  . insulin aspart  0-5 Units Subcutaneous QHS  . insulin aspart  0-9 Units Subcutaneous TID WC  . lanthanum  1,000 mg Oral TID WC  . lisinopril  40 mg Oral Daily  . loratadine  10 mg Oral Daily  . losartan  100 mg Oral Daily  . metoprolol succinate  100 mg Oral BID WC  . simvastatin  20 mg Oral q1800  . sodium bicarbonate  325 mg Oral QID  . sodium chloride  3 mL Intravenous Q12H  . [START ON 08/18/2012] sodium polystyrene  15 g Oral 2 times weekly  . [START ON 08/18/2012] Vitamin D (Ergocalciferol)  50,000 Units Oral 2 times weekly   Continuous Infusions:   Active Problems:   ESRD needing dialysis   Anemia   HTN (hypertension)   Diabetes type 2, controlled    Time spent: 15 min    Hollice Espy  [facility] Pager [phone]. If 7PM-7AM, please contact night-coverage at [email or web address], password [facility] 08/17/2012, 5:44 PM  LOS: 1 day

## 2012-08-17 NOTE — Procedures (Signed)
4 hour hemodialysis treatment completed through left upper arm AVF which was cannulated with 17g needles (antegrade) without difficulty.  Average Qb 250.  GOAL MET:  Tolerated removal of 2.1L with no interruption in ultrafiltration.  All blood was reinfused.  Hemostasis was achieved within 10 minutes.  Report given to Darvin Neighbours, RN.

## 2012-08-18 LAB — CBC
HCT: 22.5 % — ABNORMAL LOW (ref 39.0–52.0)
Hemoglobin: 7.8 g/dL — ABNORMAL LOW (ref 13.0–17.0)
MCH: 29.3 pg (ref 26.0–34.0)
MCHC: 34.7 g/dL (ref 30.0–36.0)
MCV: 84.6 fL (ref 78.0–100.0)
Platelets: 195 K/uL (ref 150–400)
RBC: 2.66 MIL/uL — ABNORMAL LOW (ref 4.22–5.81)
RDW: 12.6 % (ref 11.5–15.5)
WBC: 8.8 K/uL (ref 4.0–10.5)

## 2012-08-18 LAB — BASIC METABOLIC PANEL WITH GFR
BUN: 55 mg/dL — ABNORMAL HIGH (ref 6–23)
CO2: 28 meq/L (ref 19–32)
Calcium: 9.4 mg/dL (ref 8.4–10.5)
Chloride: 101 meq/L (ref 96–112)
Creatinine, Ser: 8.24 mg/dL — ABNORMAL HIGH (ref 0.50–1.35)
GFR calc Af Amer: 9 mL/min — ABNORMAL LOW (ref >90)
GFR calc non Af Amer: 8 mL/min — ABNORMAL LOW (ref >90)
Glucose, Bld: 126 mg/dL — ABNORMAL HIGH (ref 70–99)
Potassium: 4.1 meq/L (ref 3.5–5.1)
Sodium: 142 meq/L (ref 135–145)

## 2012-08-18 LAB — GLUCOSE, CAPILLARY
Glucose-Capillary: 133 mg/dL — ABNORMAL HIGH (ref 70–99)
Glucose-Capillary: 137 mg/dL — ABNORMAL HIGH (ref 70–99)

## 2012-08-18 LAB — HEPATITIS B SURFACE ANTIBODY,QUALITATIVE: Hep B S Ab: NONREACTIVE

## 2012-08-18 MED ORDER — TUBERCULIN PPD 5 UNIT/0.1ML ID SOLN
5.0000 [IU] | Freq: Once | INTRADERMAL | Status: DC
  Administered 2012-08-18: 5 [IU] via INTRADERMAL
  Filled 2012-08-18: qty 0.1

## 2012-08-18 MED ORDER — EPOETIN ALFA 10000 UNIT/ML IJ SOLN
14000.0000 [IU] | INTRAMUSCULAR | Status: DC
  Filled 2012-08-18 (×2): qty 2

## 2012-08-18 NOTE — Clinical Social Work Note (Signed)
Outpatient dialysis arranged at [facility] in [place]. Pt aware appointment is Wednesday at 9:30 for paperwork and treatment. He is to take medications, ID, and insurance cards to appointment. [facility] will read PPD that was placed today. Appointment added to d/c instructions.   Derenda Fennel, [place] [phone]

## 2012-08-18 NOTE — Progress Notes (Signed)
Subjective: Interval History: has no complaint of nausea or vomiting. Patient denies any difficulty breathing. Overall is feeling good..  Objective: Vital signs in last 24 hours: Temp:  [97.7 F (36.5 C)-98.3 F (36.8 C)] 98.3 F (36.8 C) (04/14 0651) Pulse Rate:  [78-99] 82 (04/14 0651) Resp:  [20] 20 (04/14 0651) BP: (142-173)/(74-88) 160/87 mmHg (04/14 0651) SpO2:  [94 %-96 %] 94 % (04/14 0651) Weight:  [100.8 kg (222 lb 3.6 oz)-103.7 kg (228 lb 9.9 oz)] 101.3 kg (223 lb 5.2 oz) (04/14 0500) Weight change: -1.9 kg (-4 lb 3 oz)  Intake/Output from previous day: 04/13 0701 - 04/14 0700 In: -  Out: 2100  Intake/Output this shift:    General appearance: alert, cooperative and no distress Resp: clear to auscultation bilaterally Cardio: regular rate and rhythm, S1, S2 normal, no murmur, click, rub or gallop GI: soft, non-tender; bowel sounds normal; no masses,  no organomegaly Extremities: extremities normal, atraumatic, no cyanosis or edema  Lab Results:  Recent Labs  08/17/12 0615 08/18/12 0550  WBC 6.9 8.8  HGB 6.5* 7.8*  HCT 18.4* 22.5*  PLT 136* 195   BMET:  Recent Labs  08/17/12 0615 08/18/12 0550  NA 138 142  K 4.5 4.1  CL 101 101  CO2 21 28  GLUCOSE 112* 126*  BUN 98* 55*  CREATININE 12.85* 8.24*  CALCIUM 9.0 9.4   No results found for this basename: PTH,  in the last 72 hours Iron Studies:  Recent Labs  08/16/12 0330  IRON 61  TIBC 283  FERRITIN 304    Studies/Results: No results found.  I have reviewed the patient's current medications.  Assessment/Plan: Problem #1 end-stage renal disease is status post hemodialysis yesterday. His BUN is 55 creatinine is 8.24 potassium of 4.1 has improved. Problem #2 anemia he status post blood transfusion hemoglobin is 7.8 hematocrit 22.5 that also has improved. Problem #3 hypertension Problem #4 diabetes Problem #5 metabolic bone disease patient presently on a binder phosphorus has improved. Problem  #6 metabolic acidosis he CO2 is 28 also that has corrected. Problem #7 diabetic retinopathy Plan: Presently patient is stable and we'll try to make arrangement to get outpatient dialysis in [place]. If patient is going to be discharged today will dialyze him either tomorrow or the day after. However if arrangements difficult to make arrangement for him to get dialysis tomorrow as an inpatient.    LOS: 2 days   Justin Ortega S 08/18/2012,8:17 AM

## 2012-08-18 NOTE — Plan of Care (Signed)
Problem: Discharge Progression Outcomes Goal: Discharge plan in place and appropriate Outcome: Adequate for Discharge Pt set up with dialysis clinic for weds. Pt and family given d/c instructions both verbalized understanding of instructions.

## 2012-08-18 NOTE — Clinical Social Work Psychosocial (Signed)
Clinical Social Work Department BRIEF PSYCHOSOCIAL ASSESSMENT 08/18/2012  Patient:  Justin Ortega, Justin Ortega     Account Number:  [id number]     Admit date:  08/16/2012  Clinical Social Worker:  Nancie Neas  Date/Time:  08/18/2012 09:30 AM  Referred by:  Physician  Date Referred:  08/18/2012 Referred for  Other - See comment   Other Referral:   outpatient dialysis   Interview type:  Patient Other interview type:   girlfriend- Victorino Dike    PSYCHOSOCIAL DATA Living Status:  PARENTS Admitted from facility:   Level of care:   Primary support name:  Victorino Dike Primary support relationship to patient:  PARTNER Degree of support available:   very supportive per pt    CURRENT CONCERNS Current Concerns  Other - See comment   Other Concerns:   outpatient dialysis    SOCIAL WORK ASSESSMENT / PLAN CSW met with pt at bedside. Pt's girlfriend, Victorino Dike and aunt present for assessment with pt's permission. Pt alert and oriented. He is blind and has ESRD. Pt is awaiting renal transplant at [facility]. He reports he lives with his father who has had a kidney transplant in the past. Both of his parents were diagnosed with ESRD at a young age. His mother died of complications. Pt reports he has known for the past 5 years that he would like be on dialysis soon. He states his best support is his girlfriend. CSW discussed outpatient dialysis options and pt requests [facility] in [place]. Referral made. Pt is open to either M, W, F or T, R, S. Pt has transportation.   Assessment/plan status:  Referral to [facility] Other assessment/ plan:   Information/referral to community resources:   Katheran James    PATIENT'S/FAMILY'S RESPONSE TO PLAN OF CARE: CSW will follow up with pt with appointment times. RN checking on PPD. Will request of MD if needed prior to d/c.        Derenda Fennel, LCSW [phone]

## 2012-08-18 NOTE — Discharge Summary (Signed)
Physician Discharge Summary  Justin Ortega FAO:[id number] DOB: 1980/12/25 DOA: 08/16/2012  PCP: Josue Hector, MD  Admit date: 08/16/2012 Discharge date: 08/18/2012  Time spent: 25 minutes  Recommendations for Outpatient Follow-up:  Patient has outpatient dialysis arranged at [facility] in [place]. His next session is scheduled for Wednesday and he will arrive early in the morning for paperwork.  1. PPD placed which be read at dialysis facility on Wednesday  Discharge Diagnoses:  Active Problems:   ESRD needing dialysis   Anemia   HTN (hypertension)   Diabetes type 2, controlled   Discharge Condition: Improved, being discharged home  Diet recommendation: Carb modified  Filed Weights   08/17/12 1040 08/17/12 1510 08/18/12 0500  Weight: 103.7 kg (228 lb 9.9 oz) 100.8 kg (222 lb 3.6 oz) 101.3 kg (223 lb 5.2 oz)    History of present illness:  Justin Ortega is an 32 y.o. male. 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 Fausto Skillern but defaulted after December when his Creatinine was found to be >8. He returned for follow up yesterday and last night was called by his nephrologist and told to go to the ED for abnormal labs. There emergency room, his labs were repeated and noted his Hb was 6.8, BUN 144, Creat 16, K 4.9, CO2 16.  He reports progressive fatigue over the past weeks, and is unable to state clearly why he has not been seen since December, but does report that his father "Was killed by dialysis, he had a heart attack on dialysis"  He denies ever having had a biopsy and denies knowledge of the cause of the kidney failure, neither his nor his father's.  He no longer needs medication to control his blood sugar, and he seesm to believe thi means he nolonger has diabetes.  Apart from fatigue is only acute symptoms aresinus congestion  a persistrent cough for 4 days.  He reports adequate urination and frequency, Day/Night 5-6/2-3. he has a mature dialysis fistula left upper extremity.   Hospital Course:  Active Problems:   ESRD needing dialysis: Patient was admitted to the hospitalist service. Nephrology was consulted and patient underwent 2 days of subsequent dialysis on 4/12 and 4/13. His symptoms improved in terms of overall conditioning. He said he felt much better. Social work was able to set up outpatient dialysis starting on 4/16.   Anemia: This was felt to be secondary to end-stage renal disease. Patient will receive Epogen during dialysis. He was also transfused 2 units packed red blood cells and by day of discharge, his hemoglobin was up to 7.8.   HTN (hypertension): Stable. Patient was continued on his antihypertensives   Diabetes type 2, controlled: A1c checked a Sunday 5.7. Patient is not on any home medications. He was monitored by sliding scale only but had episodes of hyperglycemia. This will continue to be controlled by diet.   Procedures:  Underwent hemodialysis on 4/12 and 4/13  Consultations:  [facility]-nephrology  Discharge Exam: Filed Vitals:   08/18/12 0500 08/18/12 0651 08/18/12 1014 08/18/12 1444  BP:  160/87 159/69 164/76  Pulse:  82 82 84  Temp:  98.3 F (36.8 C) 98.3 F (36.8 C) 98.1 F (36.7 C)  TempSrc:  Oral Oral Oral  Resp:  20 20 20   Height:      Weight: 101.3 kg (223 lb 5.2 oz)  SpO2:  94% 91% 92%    General: Alert and oriented x3, no acute distress Cardiovascular: Regular rate and rhythm, S1-S2 Respiratory: Clear to auscultation bilaterally  Discharge Instructions  Discharge Orders   Future Orders Complete By Expires     Diet Carb Modified  As directed     Increase activity slowly  As directed         Medication List    TAKE these medications       acetaminophen 500 MG tablet  Commonly known as:  TYLENOL  Take 500 mg by mouth every 6 (six) hours as needed  for pain.     amLODipine 5 MG tablet  Commonly known as:  NORVASC  Take 5 mg by mouth 2 (two) times daily.     calcitRIOL 0.25 MCG capsule  Commonly known as:  ROCALTROL  Take 0.25 mcg by mouth daily.     diphenhydrAMINE 25 mg capsule  Commonly known as:  BENADRYL  Take 25 mg by mouth every 6 (six) hours as needed for allergies.     hydrALAZINE 50 MG tablet  Commonly known as:  APRESOLINE  Take 50 mg by mouth 2 (two) times daily.     lanthanum 500 MG chewable tablet  Commonly known as:  FOSRENOL  Chew 1,000 mg by mouth. Take 2 tabs w/ every meal & 1 w/ every snack.     lisinopril 40 MG tablet  Commonly known as:  PRINIVIL,ZESTRIL  Take 40 mg by mouth daily.     losartan 100 MG tablet  Commonly known as:  COZAAR  Take 100 mg by mouth daily.     metoprolol succinate 100 MG 24 hr tablet  Commonly known as:  TOPROL-XL  Take 100 mg by mouth 2 (two) times daily. Take with or immediately following a meal.     pravastatin 40 MG tablet  Commonly known as:  PRAVACHOL  Take 40 mg by mouth daily.     sodium bicarbonate 325 MG tablet  Take by mouth 4 (four) times daily. 10gr tab     sodium bicarbonate 650 MG tablet  Take 650 mg by mouth 2 (two) times daily.     sodium polystyrene 15 GM/60ML suspension  Commonly known as:  KAYEXALATE  Take 15 g by mouth 2 (two) times a week.     Vitamin D (Ergocalciferol) 50000 UNITS Caps  Commonly known as:  DRISDOL  Take 50,000 Units by mouth 2 (two) times a week.           Follow-up Information   Follow up with Josue Hector, MD In 1 month.   Contact information:   [address] [facility] [address] [phone]       Follow up with [facility] On 08/20/2012. (9:30 for paperwork and treatment. Take medications, ID, and insurance cards)    Contact information:   [phone]       The results of significant diagnostics from this hospitalization (including imaging, microbiology, ancillary and laboratory) are listed  below for reference.     Labs: Basic Metabolic Panel:  Recent Labs Lab 08/16/12 0301 08/16/12 0330 08/17/12 0615 08/18/12 0550  NA 134*  --  138 142  K 4.9  --  4.5 4.1  CL 96  --  101 101  CO2 16*  --  21 28  GLUCOSE 119*  --  112* 126*  BUN 144*  --  98* 55*  CREATININE 16.06*  --  12.85* 8.24*  CALCIUM 9.4  --  9.0 9.4  MG  --  3.0*  --   --   PHOS  --   --  7.4*  --    Liver Function Tests:  Recent Labs Lab 08/16/12 0301 08/16/12 1008 08/17/12 0615  AST 13  --  11  ALT 30 29 26   ALKPHOS 65  --  60  BILITOT 0.1*  --  0.1*  PROT 6.9  --  6.5  ALBUMIN 3.7  --  3.4*   CBC:  Recent Labs Lab 08/16/12 0301 08/17/12 0615 08/18/12 0550  WBC 8.4 6.9 8.8  NEUTROABS 6.7  --   --   HGB 6.8* 6.5* 7.8*  HCT 18.9* 18.4* 22.5*  MCV 82.2 83.6 84.6  PLT 156 136* 195   CBG:  Recent Labs Lab 08/17/12 1618 08/17/12 2152 08/17/12 2156 08/18/12 0736 08/18/12 1138  GLUCAP 95 <10* 148* 133* 137*       Signed:  Braxson Hollingsworth K  [facility] 08/18/2012, 6:08 PM

## 2012-08-20 LAB — TYPE AND SCREEN
ABO/RH(D): A POS
Antibody Screen: NEGATIVE
Unit division: 0
Unit division: 0

## 2012-08-20 LAB — VITAMIN D 1,25 DIHYDROXY: Vitamin D2 1, 25 (OH)2: 9 pg/mL

## 2012-08-21 NOTE — Progress Notes (Signed)
UR Chart Review Completed  

## 2013-03-27 ENCOUNTER — Encounter (HOSPITAL_COMMUNITY): Payer: Self-pay | Admitting: Emergency Medicine

## 2013-03-27 ENCOUNTER — Inpatient Hospital Stay (HOSPITAL_COMMUNITY)
Admission: EM | Admit: 2013-03-27 | Discharge: 2013-03-31 | DRG: 193 | Disposition: A | Discharge status: Home / Self Care | Payer: Medicare Other | Attending: Internal Medicine | Admitting: Internal Medicine

## 2013-03-27 ENCOUNTER — Emergency Department (HOSPITAL_COMMUNITY): Payer: Medicare Other

## 2013-03-27 DIAGNOSIS — Z8249 Family history of ischemic heart disease and other diseases of the circulatory system: Secondary | ICD-10-CM

## 2013-03-27 DIAGNOSIS — I4892 Unspecified atrial flutter: Secondary | ICD-10-CM | POA: Diagnosis present

## 2013-03-27 DIAGNOSIS — E871 Hypo-osmolality and hyponatremia: Secondary | ICD-10-CM | POA: Diagnosis present

## 2013-03-27 DIAGNOSIS — I471 Supraventricular tachycardia: Secondary | ICD-10-CM

## 2013-03-27 DIAGNOSIS — E785 Hyperlipidemia, unspecified: Secondary | ICD-10-CM | POA: Diagnosis present

## 2013-03-27 DIAGNOSIS — E11319 Type 2 diabetes mellitus with unspecified diabetic retinopathy without macular edema: Secondary | ICD-10-CM | POA: Diagnosis present

## 2013-03-27 DIAGNOSIS — D631 Anemia in chronic kidney disease: Secondary | ICD-10-CM | POA: Diagnosis present

## 2013-03-27 DIAGNOSIS — E119 Type 2 diabetes mellitus without complications: Secondary | ICD-10-CM

## 2013-03-27 DIAGNOSIS — I4891 Unspecified atrial fibrillation: Secondary | ICD-10-CM | POA: Diagnosis present

## 2013-03-27 DIAGNOSIS — D649 Anemia, unspecified: Secondary | ICD-10-CM | POA: Diagnosis present

## 2013-03-27 DIAGNOSIS — E1139 Type 2 diabetes mellitus with other diabetic ophthalmic complication: Secondary | ICD-10-CM | POA: Diagnosis present

## 2013-03-27 DIAGNOSIS — J189 Pneumonia, unspecified organism: Principal | ICD-10-CM

## 2013-03-27 DIAGNOSIS — I120 Hypertensive chronic kidney disease with stage 5 chronic kidney disease or end stage renal disease: Secondary | ICD-10-CM | POA: Diagnosis present

## 2013-03-27 DIAGNOSIS — N186 End stage renal disease: Secondary | ICD-10-CM

## 2013-03-27 DIAGNOSIS — E860 Dehydration: Secondary | ICD-10-CM

## 2013-03-27 DIAGNOSIS — I10 Essential (primary) hypertension: Secondary | ICD-10-CM

## 2013-03-27 DIAGNOSIS — Z833 Family history of diabetes mellitus: Secondary | ICD-10-CM

## 2013-03-27 DIAGNOSIS — Z992 Dependence on renal dialysis: Secondary | ICD-10-CM

## 2013-03-27 DIAGNOSIS — I959 Hypotension, unspecified: Secondary | ICD-10-CM | POA: Diagnosis present

## 2013-03-27 DIAGNOSIS — J9600 Acute respiratory failure, unspecified whether with hypoxia or hypercapnia: Secondary | ICD-10-CM

## 2013-03-27 DIAGNOSIS — F17200 Nicotine dependence, unspecified, uncomplicated: Secondary | ICD-10-CM | POA: Diagnosis present

## 2013-03-27 DIAGNOSIS — H543 Unqualified visual loss, both eyes: Secondary | ICD-10-CM | POA: Diagnosis present

## 2013-03-27 LAB — COMPREHENSIVE METABOLIC PANEL
AST: 18 U/L (ref 0–37)
Alkaline Phosphatase: 82 U/L (ref 39–117)
BUN: 66 mg/dL — ABNORMAL HIGH (ref 6–23)
CO2: 26 mEq/L (ref 19–32)
Calcium: 9.4 mg/dL (ref 8.4–10.5)
GFR calc Af Amer: 5 mL/min — ABNORMAL LOW (ref >90)
GFR calc non Af Amer: 4 mL/min — ABNORMAL LOW (ref >90)
Glucose, Bld: 143 mg/dL — ABNORMAL HIGH (ref 70–99)
Total Protein: 6.9 g/dL (ref 6.0–8.3)

## 2013-03-27 LAB — CBC WITH DIFFERENTIAL/PLATELET
Eosinophils Absolute: 0 10*3/uL (ref 0.0–0.7)
Eosinophils Relative: 1 % (ref 0–5)
HCT: 27.9 % — ABNORMAL LOW (ref 39.0–52.0)
Hemoglobin: 9.4 g/dL — ABNORMAL LOW (ref 13.0–17.0)
Lymphs Abs: 0.4 10*3/uL — ABNORMAL LOW (ref 0.7–4.0)
MCH: 30.6 pg (ref 26.0–34.0)
MCV: 90.9 fL (ref 78.0–100.0)
Monocytes Relative: 8 % (ref 3–12)
RBC: 3.07 MIL/uL — ABNORMAL LOW (ref 4.22–5.81)

## 2013-03-27 MED ORDER — SODIUM CHLORIDE 0.9 % IJ SOLN
3.0000 mL | INTRAMUSCULAR | Status: DC | PRN
  Administered 2013-03-30: 3 mL via INTRAVENOUS

## 2013-03-27 MED ORDER — SODIUM CHLORIDE 0.9 % IJ SOLN
3.0000 mL | Freq: Two times a day (BID) | INTRAMUSCULAR | Status: DC
  Administered 2013-03-27: 21:00:00 via INTRAVENOUS
  Administered 2013-03-28 – 2013-03-30 (×3): 3 mL via INTRAVENOUS

## 2013-03-27 MED ORDER — SODIUM CHLORIDE 0.9 % IJ SOLN
3.0000 mL | Freq: Two times a day (BID) | INTRAMUSCULAR | Status: DC
  Administered 2013-03-27 – 2013-03-31 (×5): 3 mL via INTRAVENOUS

## 2013-03-27 MED ORDER — HYDRALAZINE HCL 25 MG PO TABS
50.0000 mg | ORAL_TABLET | Freq: Two times a day (BID) | ORAL | Status: DC
  Administered 2013-03-27 – 2013-03-28 (×3): 50 mg via ORAL
  Filled 2013-03-27 (×4): qty 2

## 2013-03-27 MED ORDER — SIMVASTATIN 20 MG PO TABS
20.0000 mg | ORAL_TABLET | Freq: Every day | ORAL | Status: DC
  Administered 2013-03-27 – 2013-03-29 (×3): 20 mg via ORAL
  Filled 2013-03-27 (×3): qty 1

## 2013-03-27 MED ORDER — BENZONATATE 100 MG PO CAPS
100.0000 mg | ORAL_CAPSULE | Freq: Three times a day (TID) | ORAL | Status: DC | PRN
  Administered 2013-03-27 – 2013-03-30 (×9): 100 mg via ORAL
  Filled 2013-03-27 (×9): qty 1

## 2013-03-27 MED ORDER — OXYCODONE HCL 5 MG PO TABS
5.0000 mg | ORAL_TABLET | ORAL | Status: DC | PRN
  Administered 2013-03-27 – 2013-03-30 (×8): 5 mg via ORAL
  Filled 2013-03-27 (×8): qty 1

## 2013-03-27 MED ORDER — VANCOMYCIN HCL IN DEXTROSE 1-5 GM/200ML-% IV SOLN
1000.0000 mg | INTRAVENOUS | Status: DC
  Administered 2013-03-27 – 2013-03-30 (×2): 1000 mg via INTRAVENOUS
  Filled 2013-03-27 (×3): qty 200

## 2013-03-27 MED ORDER — HEPARIN SODIUM (PORCINE) 1000 UNIT/ML DIALYSIS
20.0000 [IU]/kg | INTRAMUSCULAR | Status: DC | PRN
  Administered 2013-03-27: 2000 [IU] via INTRAVENOUS_CENTRAL
  Filled 2013-03-27: qty 2

## 2013-03-27 MED ORDER — LABETALOL HCL 200 MG PO TABS
300.0000 mg | ORAL_TABLET | Freq: Two times a day (BID) | ORAL | Status: DC
  Administered 2013-03-27 – 2013-03-28 (×2): 300 mg via ORAL
  Filled 2013-03-27 (×2): qty 2

## 2013-03-27 MED ORDER — ALUM & MAG HYDROXIDE-SIMETH 200-200-20 MG/5ML PO SUSP: 30.0000 mL | Freq: Four times a day (QID) | ORAL | Status: DC | PRN

## 2013-03-27 MED ORDER — HEPARIN SODIUM (PORCINE) 1000 UNIT/ML DIALYSIS
1000.0000 [IU] | INTRAMUSCULAR | Status: DC | PRN
  Filled 2013-03-27: qty 1

## 2013-03-27 MED ORDER — VANCOMYCIN HCL IN DEXTROSE 1-5 GM/200ML-% IV SOLN: 1000.0000 mg | Freq: Once | INTRAVENOUS | Status: DC

## 2013-03-27 MED ORDER — NEPRO/CARBSTEADY PO LIQD: 237.0000 mL | ORAL | Status: DC | PRN

## 2013-03-27 MED ORDER — SODIUM BICARBONATE 650 MG PO TABS
650.0000 mg | ORAL_TABLET | Freq: Two times a day (BID) | ORAL | Status: DC
  Administered 2013-03-27 – 2013-03-31 (×8): 650 mg via ORAL
  Filled 2013-03-27 (×8): qty 1

## 2013-03-27 MED ORDER — ALTEPLASE 2 MG IJ SOLR
2.0000 mg | Freq: Once | INTRAMUSCULAR | Status: AC | PRN
  Filled 2013-03-27: qty 2

## 2013-03-27 MED ORDER — LANTHANUM CARBONATE 500 MG PO CHEW
1000.0000 mg | CHEWABLE_TABLET | Freq: Three times a day (TID) | ORAL | Status: DC
  Administered 2013-03-28 – 2013-03-31 (×8): 1000 mg via ORAL
  Filled 2013-03-27 (×24): qty 2

## 2013-03-27 MED ORDER — ACETAMINOPHEN 650 MG RE SUPP: 650.0000 mg | Freq: Four times a day (QID) | RECTAL | Status: DC | PRN

## 2013-03-27 MED ORDER — LEVOFLOXACIN IN D5W 750 MG/150ML IV SOLN: 750.0000 mg | Freq: Once | INTRAVENOUS | Status: DC

## 2013-03-27 MED ORDER — AMLODIPINE BESYLATE 5 MG PO TABS
10.0000 mg | ORAL_TABLET | Freq: Every day | ORAL | Status: DC
  Administered 2013-03-27 – 2013-03-28 (×2): 10 mg via ORAL
  Filled 2013-03-27 (×2): qty 2

## 2013-03-27 MED ORDER — LISINOPRIL 10 MG PO TABS
40.0000 mg | ORAL_TABLET | Freq: Every day | ORAL | Status: DC
  Administered 2013-03-28: 40 mg via ORAL
  Filled 2013-03-27 (×3): qty 4

## 2013-03-27 MED ORDER — LIDOCAINE HCL (PF) 1 % IJ SOLN: 5.0000 mL | INTRAMUSCULAR | Status: DC | PRN

## 2013-03-27 MED ORDER — PIPERACILLIN-TAZOBACTAM 3.375 G IVPB
3.3750 g | Freq: Once | INTRAVENOUS | Status: DC
  Filled 2013-03-27: qty 50

## 2013-03-27 MED ORDER — SODIUM CHLORIDE 0.9 % IV SOLN: 100.0000 mL | INTRAVENOUS | Status: DC | PRN

## 2013-03-27 MED ORDER — PENTAFLUOROPROP-TETRAFLUOROETH EX AERO
1.0000 "application " | INHALATION_SPRAY | CUTANEOUS | Status: DC | PRN
  Filled 2013-03-27: qty 103.5

## 2013-03-27 MED ORDER — HEPARIN SODIUM (PORCINE) 1000 UNIT/ML DIALYSIS
300.0000 [IU] | INTRAMUSCULAR | Status: DC | PRN
  Filled 2013-03-27: qty 1

## 2013-03-27 MED ORDER — SODIUM CHLORIDE 0.9 % IV SOLN: 250.0000 mL | INTRAVENOUS | Status: DC | PRN

## 2013-03-27 MED ORDER — PIPERACILLIN-TAZOBACTAM IN DEX 2-0.25 GM/50ML IV SOLN
2.2500 g | Freq: Three times a day (TID) | INTRAVENOUS | Status: DC
  Administered 2013-03-27 – 2013-03-30 (×8): 2.25 g via INTRAVENOUS
  Filled 2013-03-27 (×14): qty 50

## 2013-03-27 MED ORDER — OMEGA-3-ACID ETHYL ESTERS 1 G PO CAPS
2000.0000 mg | ORAL_CAPSULE | Freq: Two times a day (BID) | ORAL | Status: DC
  Administered 2013-03-27 – 2013-03-31 (×8): 2000 mg via ORAL
  Filled 2013-03-27 (×8): qty 2

## 2013-03-27 MED ORDER — PIPERACILLIN-TAZOBACTAM 3.375 G IVPB
3.3750 g | Freq: Once | INTRAVENOUS | Status: DC
Start: 2013-03-27 — End: 2013-03-27

## 2013-03-27 MED ORDER — HEPARIN SODIUM (PORCINE) 5000 UNIT/ML IJ SOLN
5000.0000 [IU] | Freq: Three times a day (TID) | INTRAMUSCULAR | Status: DC
Start: 2013-03-27 — End: 2013-03-31
  Administered 2013-03-27 – 2013-03-31 (×11): 5000 [IU] via SUBCUTANEOUS
  Filled 2013-03-27 (×11): qty 1

## 2013-03-27 MED ORDER — ACETAMINOPHEN 325 MG PO TABS
650.0000 mg | ORAL_TABLET | Freq: Four times a day (QID) | ORAL | Status: DC | PRN
  Administered 2013-03-27 – 2013-03-28 (×3): 650 mg via ORAL
  Filled 2013-03-27 (×3): qty 2

## 2013-03-27 MED ORDER — LIDOCAINE-PRILOCAINE 2.5-2.5 % EX CREA
1.0000 "application " | TOPICAL_CREAM | CUTANEOUS | Status: DC | PRN
  Filled 2013-03-27: qty 5

## 2013-03-27 NOTE — ED Notes (Signed)
Per Dr Kristian Covey patient to have antibiotics after dialysis today. Cathlean Cower RN made aware.

## 2013-03-27 NOTE — Progress Notes (Signed)
Patient medication not given due to patient going to dialysis on admission. Discussed patient medication regimen at home with patient. Notified pharmacy to adjust medication schedule accordingly.

## 2013-03-27 NOTE — Consult Note (Signed)
Reason for Consult: End-stage renal disease for dialysis Referring Physician: Dr. Marylene Land is an 32 y.o. male.  HPI: Is a patient was history of diabetes, hypertension, end-stage renal disease on maintenance hemodialysis presently came with history of cough and sputum production of about 2 weeks duration. According to the patient he started having cough with some joint pain for the last 10 days. Since he was feeling okay he didn't bother to mention on dialysis. Her last 3 days however patient does start having productive sputum which is white. This is associated with fever, chills and generalized weakness. Because of that he did come to the dialysis unit yesterday. Since his condition continued to get worse he decided to come to the emergency room where he was found to have pneumonia. Presently patient denies any nausea vomiting.  Past Medical History  Diagnosis Date  . Renal disorder   . Blind in both eyes   . Hypertension     Past Surgical History  Procedure Laterality Date  . Eye surgery    . Dialysis fistula creation      Family History  Problem Relation Age of Onset  . Diabetes Mother   . Hypertension Mother   . Diabetes Father   . Hypertension Father   . Heart disease Mother     Died (AMI) on dialysis  . Heart disease Father   . Kidney failure Father   . Kidney failure Mother     Social History:  reports that he has been smoking Cigarettes.  He has been smoking about 0.25 packs per day. He does not have any smokeless tobacco history on file. He reports that he does not drink alcohol or use illicit drugs.  Allergies:  Allergies  Allergen Reactions  . Bystolic [Nebivolol Hcl] Palpitations    Medications: I have reviewed the patient's current medications.  Results for orders placed during the hospital encounter of 03/27/13 (from the past 48 hour(s))  CBC WITH DIFFERENTIAL     Status: Abnormal   Collection Time    03/27/13 10:57 AM      Result Value Range    WBC 4.9  4.0 - 10.5 K/uL   RBC 3.07 (*) 4.22 - 5.81 MIL/uL   Hemoglobin 9.4 (*) 13.0 - 17.0 g/dL   HCT 29.5 (*) 62.1 - 30.8 %   MCV 90.9  78.0 - 100.0 fL   MCH 30.6  26.0 - 34.0 pg   MCHC 33.7  30.0 - 36.0 g/dL   RDW 65.7  84.6 - 96.2 %   Platelets 117 (*) 150 - 400 K/uL   Neutrophils Relative % 84 (*) 43 - 77 %   Neutro Abs 4.2  1.7 - 7.7 K/uL   Lymphocytes Relative 7 (*) 12 - 46 %   Lymphs Abs 0.4 (*) 0.7 - 4.0 K/uL   Monocytes Relative 8  3 - 12 %   Monocytes Absolute 0.4  0.1 - 1.0 K/uL   Eosinophils Relative 1  0 - 5 %   Eosinophils Absolute 0.0  0.0 - 0.7 K/uL   Basophils Relative 0  0 - 1 %   Basophils Absolute 0.0  0.0 - 0.1 K/uL  COMPREHENSIVE METABOLIC PANEL     Status: Abnormal   Collection Time    03/27/13 10:57 AM      Result Value Range   Sodium 137  135 - 145 mEq/L   Potassium 4.9  3.5 - 5.1 mEq/L   Chloride 94 (*) 96 - 112  mEq/L   CO2 26  19 - 32 mEq/L   Glucose, Bld 143 (*) 70 - 99 mg/dL   BUN 66 (*) 6 - 23 mg/dL   Creatinine, Ser 16.10 (*) 0.50 - 1.35 mg/dL   Calcium 9.4  8.4 - 96.0 mg/dL   Total Protein 6.9  6.0 - 8.3 g/dL   Albumin 3.3 (*) 3.5 - 5.2 g/dL   AST 18  0 - 37 U/L   ALT 17  0 - 53 U/L   Alkaline Phosphatase 82  39 - 117 U/L   Total Bilirubin 0.4  0.3 - 1.2 mg/dL   GFR calc non Af Amer 4 (*) >90 mL/min   GFR calc Af Amer 5 (*) >90 mL/min   Comment: (NOTE)     The eGFR has been calculated using the CKD EPI equation.     This calculation has not been validated in all clinical situations.     eGFR's persistently <90 mL/min signify possible Chronic Kidney     Disease.    Dg Chest 2 View  03/27/2013   CLINICAL DATA:  Cough, shortness of breath  EXAM: CHEST  2 VIEW  COMPARISON:  03/04/2007  FINDINGS: Enlargement of cardiac silhouette.  Mediastinal contours normal.  Mild pulmonary vascular congestion.  Extensive airspace infiltrate identified in both the left upper and left lower lobes most consistent with pneumonia.  Central peribronchial  thickening noted.  Right lung grossly clear.  Question component of pleural effusion at lateral left lung base.  No pneumothorax or acute osseous findings.  IMPRESSION: Enlargement of cardiac silhouette.  Extensive infiltrate in left upper and left lower lobes most consistent with pneumonia.  Question small left pleural effusion.   Electronically Signed   By: Ulyses Southward M.D.   On: 03/27/2013 10:52    Review of Systems  Constitutional: Positive for fever and chills.  Respiratory: Positive for cough, sputum production and wheezing. Negative for hemoptysis and shortness of breath.   Cardiovascular: Negative for orthopnea and PND.  Gastrointestinal: Negative for nausea, vomiting and diarrhea.   Blood pressure 153/74, pulse 85, temperature 99.5 F (37.5 C), temperature source Oral, resp. rate 18, height 5\' 8"  (1.727 m), weight 99.338 kg (219 lb), SpO2 94.00%. Physical Exam  Constitutional: He is oriented to person, place, and time.  HENT:  Mouth/Throat: No oropharyngeal exudate.  Eyes: No scleral icterus.  Neck: No JVD present.  Cardiovascular: Normal rate and regular rhythm.   No murmur heard. Respiratory: He has wheezes. He has no rales. He exhibits no tenderness.  GI: He exhibits no distension. There is no tenderness.  Musculoskeletal: He exhibits edema.  Neurological: He is alert and oriented to person, place, and time.    Assessment/Plan: Problem #1 pneumonia. Presently patient has extensive infiltrate on the upper and left lower lobe infiltrate. Patient presently was low grade temperature but his white blood cell count is normal. Problem #2 end-stage renal disease is status post hemodialysis on Monday presently he doesn't have any uremic sign and symptoms normal potassium. Patient has trace to 1+ edema. Problem #2 hypertension his blood pressure seems reasonably controlled Problem #4 diabetes Problem #5 anemia this secondary to chronic renal failure. Problem #6 metabolic bone  disease Problem #7 diabetic retinopathy and patient with impaired vision. Plan: We'll make arrangements for patient to get dialysis today           We'll use 3k/2.5 calcium bath           We'll try to remove 31/2  if his blood pressure tolerates.            We'll check his basic metabolic panel, phosphorus and CBC in the morning.            We'll use  Epogen 10,000 units IV after each dialysis.  Kelty Szafran S 03/27/2013, 1:05 PM

## 2013-03-27 NOTE — ED Notes (Signed)
Has had cough for 3 weeks, has waxed and waned.  Yesterday shortness of breath had gotten bad enough to go to MD.  They diagnosed w/bronchitis and URI.  Given Z-pak and took dose yesterday and today. Had fever of 100.1 after Tylenol today.  F/U w/MD today who referred here d/t decreased SpO2 and diminished lung bases.  Patient does HD TTS. Missed yesterday d/t illness. Was to go today and tomorrow.

## 2013-03-27 NOTE — Discharge Summary (Deleted)
[facility]  History and Physical  Justin Ortega MRN:[id number] DOB: 08/14/1980 DOA: 03/27/2013  Referring physician:  PCP: NYLAND,LEONARD ROBERT, MD  Specialists:  Chief Complaint: Cough/shortness of breath  HPI: Justin Ortega is a 32 y.o. male with a past medical history of end-stage renal disease, on hemodialysis Tuesdays Thursdays and Saturdays, who presents emergency department with complaints of increasing cough and shortness of breath. He states symptoms started approximately 2 weeks ago, with coughing congestion which did not improve over time. In the last 3-4 days, he had increasing cough, with clear sputum production, generalized weakness, malaise, shortness of breath and feeling sick overall. He states yesterday missing dialysis today and unable to leave his home due to his illness. He presented to the emergency department today where a chest x-ray revealed extensive airspace infiltrate in both left upper and left lower lobes consistent with pneumonia. He denies sick contacts or recent travel. He has not been on recent antibiotic therapy. in the emergency room he was mentating well with stable vital signs. I discussed case with his nephrologist made him aware of patient's admission and need for hemodialysis.  Review of Systems: The patient denies anorexia, fever, weight loss,, vision loss, decreased hearing, hoarseness, chest pain, syncope, peripheral edema, balance deficits, hemoptysis, abdominal pain, melena, hematochezia, severe indigestion/heartburn, hematuria, incontinence, genital sores, muscle weakness, suspicious skin lesions, transient blindness, difficulty walking, depression, unusual weight change, abnormal bleeding, enlarged lymph nodes, angioedema, and breast masses.  Past Medical History   Diagnosis  Date   .  Renal disorder    .  Blind in both eyes    .  Hypertension     Past Surgical History   Procedure  Laterality  Date   .  Eye surgery     .  Dialysis fistula  creation      Social History: reports that he has been smoking Cigarettes. He has been smoking about 0.25 packs per day. He does not have any smokeless tobacco history on file. He reports that he does not drink alcohol or use illicit drugs.  Allergies   Allergen  Reactions   .  Bystolic [Nebivolol Hcl]  Palpitations    Family History   Problem  Relation  Age of Onset   .  Diabetes  Mother    .  Hypertension  Mother    .  Diabetes  Father    .  Hypertension  Father    .  Heart disease  Mother      Died (AMI) on dialysis   .  Heart disease  Father    .  Kidney failure  Father    .  Kidney failure  Mother     Prior to Admission medications   Medication  Sig  Start Date  End Date  Taking?  Authorizing Provider   acetaminophen (TYLENOL) 500 MG tablet  Take 500 mg by mouth every 6 (six) hours as needed for pain.    Yes  Historical Provider, MD   amLODipine (NORVASC) 5 MG tablet  Take 10 mg by mouth daily.    Yes  Historical Provider, MD   diphenhydrAMINE (BENADRYL) 25 mg capsule  Take 25 mg by mouth every 6 (six) hours as needed for allergies.    Yes  Historical Provider, MD   hydrALAZINE (APRESOLINE) 50 MG tablet  Take 50 mg by mouth 2 (two) times daily.    Yes  Historical Provider, MD   labetalol (NORMODYNE) 300 MG tablet  Take 300 mg   by mouth 2 (two) times daily.    Yes  Historical Provider, MD   lanthanum (FOSRENOL) 500 MG chewable tablet  Chew 1,000 mg by mouth. Take 2 tabs w/ every meal & 1 w/ every snack.    Yes  Historical Provider, MD   lisinopril (PRINIVIL,ZESTRIL) 40 MG tablet  Take 40 mg by mouth daily.    Yes  Historical Provider, MD   Omega-3 Fatty Acids (FISH OIL) 1000 MG CAPS  Take 2,000 mg by mouth 2 (two) times daily.    Yes  Historical Provider, MD   pravastatin (PRAVACHOL) 40 MG tablet  Take 40 mg by mouth daily.    Yes  Historical Provider, MD   sodium bicarbonate 650 MG tablet  Take 650 mg by mouth 2 (two) times daily.    Yes  Historical Provider, MD   Physical Exam:   Filed Vitals:    03/27/13 1311   BP:  179/77   Pulse:  93   Temp:  99.8 F (37.7 C)   Resp:  21    General: Well-nourished well-developed male, in no acute distress. He is awake alert and oriented x3  Eyes: Pupils are equal round reactive to light extraocular movement is intact  Neck: Neck supple symmetrical no jugular venous distention or carotid bruit  Cardiovascular: Regular rate and rhythm normal S1-S2 no murmurs rubs or gallops  Respiratory: Patient having left sided crackles, coarse respiratory sounds, normal respiratory effort. Right lung is clear  Abdomen: Soft nontender nondistended positive bowel sounds  Skin: No rashes or lesions  Musculoskeletal: Present range of motion to all extremities  Psychiatric: Awake alert and oriented x3  Neurologic: Cranial nerves II through XII grossly intact no alteration to sensation global 5 of 5 muscle strength Labs on Admission:  Basic Metabolic Panel:   Recent Labs  Lab  03/27/13 1057   NA  137   K  4.9   CL  94*   CO2  26   GLUCOSE  143*   BUN  66*   CREATININE  14.38*   CALCIUM  9.4    Liver Function Tests:   Recent Labs  Lab  03/27/13 1057   AST  18   ALT  17   ALKPHOS  82   BILITOT  0.4   PROT  6.9   ALBUMIN  3.3*    No results found for this basename: LIPASE, AMYLASE, in the last 168 hours  No results found for this basename: AMMONIA, in the last 168 hours  CBC:   Recent Labs  Lab  03/27/13 1057   WBC  4.9   NEUTROABS  4.2   HGB  9.4*   HCT  27.9*   MCV  90.9   PLT  117*    Cardiac Enzymes:  No results found for this basename: CKTOTAL, CKMB, CKMBINDEX, TROPONINI, in the last 168 hours  BNP (last 3 results)  No results found for this basename: PROBNP, in the last 8760 hours  CBG:  No results found for this basename: GLUCAP, in the last 168 hours  Radiological Exams on Admission:  Dg Chest 2 View  03/27/2013 CLINICAL DATA: Cough, shortness of breath EXAM: CHEST 2 VIEW COMPARISON: 03/04/2007 FINDINGS:  Enlargement of cardiac silhouette. Mediastinal contours normal. Mild pulmonary vascular congestion. Extensive airspace infiltrate identified in both the left upper and left lower lobes most consistent with pneumonia. Central peribronchial thickening noted. Right lung grossly clear. Question component of pleural effusion at lateral left lung base. No   pneumothorax or acute osseous findings. IMPRESSION: Enlargement of cardiac silhouette. Extensive infiltrate in left upper and left lower lobes most consistent with pneumonia. Question small left pleural effusion. Electronically Signed By: Mark Boles M.D. On: 03/27/2013 10:52   EKG: Independently reviewed.  Assessment/Plan  Active Problems:  HCAP (healthcare-associated pneumonia)  ESRD needing dialysis  Anemia  HTN (hypertension)  Diabetes type 2, controlled   1. Healthcare associated pneumonia. Mr. Wollard is a dialysis patient, undergoing HD on Tuesday Thursdays and Saturdays, who presents with complaints of cough shortness of breath, imaging studies today showed the development of extensive airspace disease involving left lung. Will start patient on broad spectrum antibiotic therapy with IV vancomycin and Zosyn. Provide supportive care, followup on blood cultures. At the time of admission he is hemodynamically stable, satting upper 90s on 2 L supplemental oxygen. 2. Acute respiratory failure. Evidence by an O2 sat of 89% on room air, patient requiring supplemental oxygen, likely secondary to healthcare associated pneumonia. Starting broad-spectrum empiric antibiotic therapy, provide supportive care, supplemental oxygen 3. End-stage renal disease. Case discussed with his nephrologist, patient to undergo hemodialysis today as he missed his HD session yesterday. 4. Hypertension. Patient on multiple antihypertensive agents, will continue his home regimen with lisinopril, hydralazine, labetalol, Norvasc. 5. Dyslipidemia. Continue statin therapy 6. Nutrition.  Renal diet 7. DVT prophylaxis. Subcutaneous heparin Code Status: Full code  Family Communication: Plan discussed with family members present at bedside  Disposition Plan: Will admit patient to telemetry, start broad-spectrum empiric IV antibiotic therapy, patient to receive hemodialysis during this hospitalization  Time spent: 70 min  Jenya Putz  [facility]  Pager [phone]  If 7PM-7AM, please contact night-coverage  [email or web address]  Password [email or web address]  03/27/2013, 1:53 PM  

## 2013-03-27 NOTE — Progress Notes (Signed)
ANTIBIOTIC CONSULT NOTE - INITIAL  Pharmacy Consult for Vancomycin and Zosyn Indication: rule out pneumonia  Allergies  Allergen Reactions  . Bystolic [Nebivolol Hcl] Palpitations    Patient Measurements: Height: 5\' 8"  (172.7 cm) Weight: 219 lb (99.338 kg) IBW/kg (Calculated) : 68.4  Vital Signs: Temp: 98.9 F (37.2 C) (11/21 1412) Temp src: Oral (11/21 1412) BP: 202/97 mmHg (11/21 1412) Pulse Rate: 94 (11/21 1412) Intake/Output from previous day:   Intake/Output from this shift:    Labs:  Recent Labs  03/27/13 1057  WBC 4.9  HGB 9.4*  PLT 117*  CREATININE 14.38*   Estimated Creatinine Clearance: 8.4 ml/min (by C-G formula based on Cr of 14.38). No results found for this basename: VANCOTROUGH, Leodis Binet, VANCORANDOM, GENTTROUGH, GENTPEAK, GENTRANDOM, TOBRATROUGH, TOBRAPEAK, TOBRARND, AMIKACINPEAK, AMIKACINTROU, AMIKACIN,  in the last 72 hours   Microbiology: Recent Results (from the past 720 hour(s))  CULTURE, BLOOD (ROUTINE X 2)     Status: None   Collection Time    03/27/13 12:31 PM      Result Value Range Status   Specimen Description BLOOD LEFT HAND   Final   Special Requests BOTTLES DRAWN AEROBIC AND ANAEROBIC  8 CC   Final   Culture PENDING   Incomplete   Report Status PENDING   Incomplete  CULTURE, BLOOD (ROUTINE X 2)     Status: None   Collection Time    03/27/13 12:31 PM      Result Value Range Status   Specimen Description LEFT ANTECUBITAL   Final   Special Requests BOTTLES DRAWN AEROBIC AND ANAEROBIC 15 CC   Final   Culture PENDING   Incomplete   Report Status PENDING   Incomplete    Medical History: Past Medical History  Diagnosis Date  . Renal disorder   . Blind in both eyes   . Hypertension     Medications:  Scheduled:  . amLODipine  10 mg Oral Daily  . heparin  5,000 Units Subcutaneous Q8H  . hydrALAZINE  50 mg Oral BID  . labetalol  300 mg Oral BID  . lanthanum  1,000 mg Oral TID WC  . lisinopril  40 mg Oral Daily  . omega-3  acid ethyl esters  2,000 mg Oral BID  . piperacillin-tazobactam (ZOSYN)  IV  2.25 g Intravenous Q8H  . simvastatin  20 mg Oral q1800  . sodium bicarbonate  650 mg Oral BID  . sodium chloride  3 mL Intravenous Q12H  . sodium chloride  3 mL Intravenous Q12H  . vancomycin  1,000 mg Intravenous Q M,W,F-HD   Assessment: 32yo male with h/o ESRD requiring dialysis admitted for worsening SOB and suspected pna.  Plans are being made for pt to get dialysis today.  Goal of Therapy:  Pre-Hemodialysis Vancomycin level goal range =15-25 mcg/ml  Plan:  Zosyn 2.25gm IV q8h (adjusted for dialysis) Vancomycin 1gm IV with each dialysis (dose today) Check pre-dialysis level at steady state Monitor labs, cultures, and progress  Valrie Hart A 03/27/2013,2:18 PM

## 2013-03-27 NOTE — Procedures (Signed)
   HEMODIALYSIS TREATMENT NOTE:  4 hour hemodialysis completed via left upper arm AVF (15g/antegrade).  Goal not met.  Pt unable to tolerate removal of 3.5 liters as ordered due to cramping.  Ultrafiltration was interrupted for 45 minutes.  Net fluid removed:  2.1 liters.  Hypertensive pre-, intra-, and post-HD.  Previously held antihypertensives were administered post dialysis.  Report given to Kennith Center, Charity fundraiser.  Tanesha Arambula L. Chastin Garlitz, RN, CDN

## 2013-03-27 NOTE — ED Provider Notes (Signed)
CSN: [id number]     Arrival date & time 03/27/13  4098 History   First MD Initiated Contact with Patient 03/27/13 1010     This chart was scribed for Benny Lennert, MD by Manuela Schwartz, ED scribe. This patient was seen in room APA19/APA19 and the patient's care was started at 1010.  Chief Complaint  Patient presents with  . Shortness of Breath  . Cough   Patient is a 32 y.o. male presenting with cough. The history is provided by the patient. No language interpreter was used.  Cough Cough characteristics:  Harsh Severity:  Moderate Onset quality:  Gradual Duration:  3 weeks Timing:  Constant Progression:  Worsening Chronicity:  New Context: upper respiratory infection   Context: not sick contacts   Relieved by:  Nothing Worsened by:  Nothing tried Ineffective treatments:  Cough suppressants Associated symptoms: chills, fever, rhinorrhea, shortness of breath, sinus congestion and sore throat   Associated symptoms: no chest pain, no diaphoresis, no ear fullness, no ear pain, no headaches, no myalgias and no rash    HPI Comments: Justin Ortega is a 32 y.o. male who presents to the Emergency Department complaining of constant, gradually worsening cough/congestion for 3 weeks. He states missed dialysis appt yesterday (regularly Tuesday, Thursday and Saturday); he states tried to go again to day but was still not feeling well. He reports seeing PCP Dr. Lysbeth Galas recently who is concerned for possible pneumonia. Pt reports feeling mildly SOB, onset yesterday and not normally on oxygen. He reports was dx w/bronchitis/URI and rx w/Z-pak. He reports associated fever/chills over the past few days (Max T 100.1 F). He took tylenol today w/minimal improvement. He reports to have a kidney transplant in the coming months or year.   Past Medical History  Diagnosis Date  . Renal disorder   . Blind in both eyes   . Hypertension    Past Surgical History  Procedure Laterality Date  . Eye surgery    .  Dialysis fistula creation     Family History  Problem Relation Age of Onset  . Diabetes Mother   . Hypertension Mother   . Diabetes Father   . Hypertension Father   . Heart disease Mother     Died ([facility]) on dialysis  . Heart disease Father   . Kidney failure Father   . Kidney failure Mother    History  Substance Use Topics  . Smoking status: Current Every Day Smoker -- 0.25 packs/day    Types: Cigarettes  . Smokeless tobacco: Not on file  . Alcohol Use: No    Review of Systems  Constitutional: Positive for fever and chills. Negative for diaphoresis.  HENT: Positive for rhinorrhea and sore throat. Negative for congestion and ear pain.   Respiratory: Positive for cough and shortness of breath.   Cardiovascular: Negative for chest pain.  Gastrointestinal: Negative for nausea, vomiting, abdominal pain and diarrhea.  Musculoskeletal: Negative for back pain and myalgias.  Skin: Negative for color change and rash.  Neurological: Negative for syncope and headaches.  All other systems reviewed and are negative.   A complete 10 system review of systems was obtained and all systems are negative except as noted in the HPI and PMH.   Allergies  Bystolic  Home Medications   Current Outpatient Rx  Name  Route  Sig  Dispense  Refill  . acetaminophen (TYLENOL) 500 MG tablet   Oral   Take 500 mg by mouth every 6 (six) hours as  needed for pain.         Marland Kitchen amLODipine (NORVASC) 5 MG tablet   Oral   Take 5 mg by mouth 2 (two) times daily.         . calcitRIOL (ROCALTROL) 0.25 MCG capsule   Oral   Take 0.25 mcg by mouth daily.         . diphenhydrAMINE (BENADRYL) 25 mg capsule   Oral   Take 25 mg by mouth every 6 (six) hours as needed for allergies.         . hydrALAZINE (APRESOLINE) 50 MG tablet   Oral   Take 50 mg by mouth 2 (two) times daily.         Marland Kitchen lanthanum (FOSRENOL) 500 MG chewable tablet   Oral   Chew 1,000 mg by mouth. Take 2 tabs w/ every meal & 1 w/  every snack.         Marland Kitchen lisinopril (PRINIVIL,ZESTRIL) 40 MG tablet   Oral   Take 40 mg by mouth daily.         Marland Kitchen losartan (COZAAR) 100 MG tablet   Oral   Take 100 mg by mouth daily.         . metoprolol succinate (TOPROL-XL) 100 MG 24 hr tablet   Oral   Take 100 mg by mouth 2 (two) times daily. Take with or immediately following a meal.         . pravastatin (PRAVACHOL) 40 MG tablet   Oral   Take 40 mg by mouth daily.         . sodium bicarbonate 325 MG tablet   Oral   Take by mouth 4 (four) times daily. 10gr tab         . sodium bicarbonate 650 MG tablet   Oral   Take 650 mg by mouth 2 (two) times daily.         . sodium polystyrene (KAYEXALATE) 15 GM/60ML suspension   Oral   Take 15 g by mouth 2 (two) times a week.         . Vitamin D, Ergocalciferol, (DRISDOL) 50000 UNITS CAPS   Oral   Take 50,000 Units by mouth 2 (two) times a week.          Triage Vitals: BP 153/74  Pulse 85  Temp(Src) 99.5 F (37.5 C) (Oral)  Resp 18  Ht 5\' 8"  (1.727 m)  Wt 219 lb (99.338 kg)  BMI 33.31 kg/m2  SpO2 89% Physical Exam  Constitutional: He is oriented to person, place, and time. He appears well-developed.  HENT:  Head: Normocephalic.  Eyes: Conjunctivae and EOM are normal. No scleral icterus.  Neck: Neck supple. No thyromegaly present.  Cardiovascular: Normal rate and regular rhythm.  Exam reveals no gallop and no friction rub.   No murmur heard. Pulmonary/Chest: No stridor. He has no wheezes. He has no rales. He exhibits no tenderness.  Abdominal: He exhibits no distension. There is no tenderness. There is no rebound.  Musculoskeletal: Normal range of motion. He exhibits no edema.  Lymphadenopathy:    He has no cervical adenopathy.  Neurological: He is oriented to person, place, and time. He exhibits normal muscle tone. Coordination normal.  Skin: No rash noted. No erythema.  Psychiatric: He has a normal mood and affect. His behavior is normal.    ED  Course  Procedures (including critical care time) DIAGNOSTIC STUDIES: Oxygen Saturation is 89% on room air, normal by my interpretation.  COORDINATION OF CARE: At 1030 AM Discussed treatment plan with patient which includes CXR, blood work. Patient agrees.   Labs Review Labs Reviewed  CBC WITH DIFFERENTIAL  COMPREHENSIVE METABOLIC PANEL   Imaging Review No results found.  EKG Interpretation   None       MDM  Admit,  pneumonia     Benny Lennert, MD 03/27/13 1156

## 2013-03-28 LAB — CBC
Platelets: 123 10*3/uL — ABNORMAL LOW (ref 150–400)
RDW: 13.2 % (ref 11.5–15.5)
WBC: 4.4 10*3/uL (ref 4.0–10.5)

## 2013-03-28 LAB — COMPREHENSIVE METABOLIC PANEL
Alkaline Phosphatase: 76 U/L (ref 39–117)
BUN: 34 mg/dL — ABNORMAL HIGH (ref 6–23)
CO2: 29 mEq/L (ref 19–32)
Chloride: 92 mEq/L — ABNORMAL LOW (ref 96–112)
GFR calc Af Amer: 8 mL/min — ABNORMAL LOW (ref >90)
GFR calc non Af Amer: 7 mL/min — ABNORMAL LOW (ref >90)
Glucose, Bld: 125 mg/dL — ABNORMAL HIGH (ref 70–99)
Potassium: 4.8 mEq/L (ref 3.5–5.1)
Sodium: 137 mEq/L (ref 135–145)
Total Bilirubin: 0.4 mg/dL (ref 0.3–1.2)

## 2013-03-28 LAB — PHOSPHORUS: Phosphorus: 4.3 mg/dL (ref 2.3–4.6)

## 2013-03-28 MED ORDER — SODIUM CHLORIDE 0.9 % IV SOLN
500.0000 mg | Freq: Once | INTRAVENOUS | Status: AC
  Administered 2013-03-28: 500 mg via INTRAVENOUS
  Filled 2013-03-28: qty 500

## 2013-03-28 MED ORDER — CLONIDINE HCL 0.2 MG PO TABS
0.2000 mg | ORAL_TABLET | Freq: Three times a day (TID) | ORAL | Status: DC | PRN
  Administered 2013-03-28: 0.2 mg via ORAL
  Filled 2013-03-28: qty 1

## 2013-03-28 MED ORDER — LABETALOL HCL 200 MG PO TABS
400.0000 mg | ORAL_TABLET | Freq: Two times a day (BID) | ORAL | Status: DC
  Administered 2013-03-28 – 2013-03-29 (×2): 400 mg via ORAL
  Filled 2013-03-28 (×2): qty 2

## 2013-03-28 MED ORDER — LORAZEPAM 1 MG PO TABS
1.0000 mg | ORAL_TABLET | Freq: Two times a day (BID) | ORAL | Status: DC | PRN
  Administered 2013-03-28 – 2013-03-30 (×2): 1 mg via ORAL
  Filled 2013-03-28: qty 2
  Filled 2013-03-28: qty 1

## 2013-03-28 NOTE — Progress Notes (Signed)
MD paged about elevated heart rate, pt has no c/o CP and has been running an elevated temp on and off all day, is otherwise stable.

## 2013-03-28 NOTE — Progress Notes (Signed)
[facility] PROGRESS NOTE  Justin Ortega ZOX:[id number] DOB: 05-Aug-1980 DOA: 03/27/2013 PCP: Josue Hector, MD  Assessment/Plan: 1. Healthcare associated pneumonia. Chest x-ray showing extensive airspace infiltrate involving left upper and left lower lobes. He is a hemodialysis patient for which I have started broad-spectrum empiric IV antibiotic therapy with Zosyn and vancomycin. He appears to be improved this morning, did spike a temperature 102.7. Pending blood cultures. We'll continue current antibiotic regimen. Plan to obtain a followup chest x-ray in a.m. 2. acute respiratory failure evidence by an O2 sats of 89% on room air requiring supplemental oxygen. Likely secondary to pneumonia. Continue supplemental oxygen and empiric IV antibiotic coverage. 3. End-stage renal disease. Nephrology consulted appreciate recommendations 4. Hypertension, poorly controlled. Patient's labetalol was increased to 400 mg by mouth twice a day. Appreciate nephrology's assistance. We'll continue hydralazine 50 mg twice a day, lisinopril 40 mg daily and amlodipine 10 mg daily 5. Nutrition. Renal diet  Code Status: Full code Family Communication: Plan discussed with family members present at bedside Disposition Plan: Continue broad-spectrum empiric IV antibiotic therapy, supportive care, supplemental oxygen   Consultants:  Nephrology  Antibiotics:  Vancomycin (started on 03/27/2013)  Zosyn (started on monthly 1 2014)  HPI/Subjective: Patient is a pleasant 32 year old gentleman with a past medical history of end-stage renal disease, who was admitted yesterday evening, presenting with increasing cough shortness of breath. Chest x-ray revealed extensive airspace infiltrate in both left upper and left lower lobes consistent with pneumonia. He was started on broad-spectrum empiric IV antibiotic therapy with IV vancomycin and Zosyn for presumed healthcare associated pneumonia.  Objective: Filed  Vitals:   03/28/13 0901  BP: 133/87  Pulse: 144  Temp: 99.4 F (37.4 C)  Resp: 20    Intake/Output Summary (Last 24 hours) at 03/28/13 1333 Last data filed at 03/28/13 0900  Gross per 24 hour  Intake    900 ml  Output      0 ml  Net    900 ml   Filed Weights   03/27/13 1017 03/27/13 1445 03/28/13 0619  Weight: 99.338 kg (219 lb) 100.8 kg (222 lb 3.6 oz) 102.1 kg (225 lb 1.4 oz)    Exam:   General:  Patient is in no acute distress, he is awake alert oriented reports feeling better today  Cardiovascular: Regular rate and rhythm normal S1-S2, tachycardic  Respiratory: Patient having extensive left sided crackles and rhonchi, right lung is clear to auscultation bilaterally  Abdomen: Soft nontender nondistended positive bowel  Musculoskeletal: Present range of motion to all extremities  Data Reviewed: Basic Metabolic Panel:  Recent Labs Lab 03/27/13 1057 03/28/13 0632  NA 137 137  K 4.9 4.8  CL 94* 92*  CO2 26 29  GLUCOSE 143* 125*  BUN 66* 34*  CREATININE 14.38* 8.79*  CALCIUM 9.4 9.6  PHOS  --  4.3   Liver Function Tests:  Recent Labs Lab 03/27/13 1057 03/28/13 0632  AST 18 18  ALT 17 18  ALKPHOS 82 76  BILITOT 0.4 0.4  PROT 6.9 6.9  ALBUMIN 3.3* 3.2*   No results found for this basename: LIPASE, AMYLASE,  in the last 168 hours No results found for this basename: AMMONIA,  in the last 168 hours CBC:  Recent Labs Lab 03/27/13 1057 03/28/13 0632  WBC 4.9 4.4  NEUTROABS 4.2  --   HGB 9.4* 10.4*  HCT 27.9* 31.5*  MCV 90.9 92.1  PLT 117* 123*   Cardiac Enzymes: No results found for this basename: CKTOTAL,  CKMB, CKMBINDEX, TROPONINI,  in the last 168 hours BNP (last 3 results) No results found for this basename: PROBNP,  in the last 8760 hours CBG: No results found for this basename: GLUCAP,  in the last 168 hours  Recent Results (from the past 240 hour(s))  CULTURE, BLOOD (ROUTINE X 2)     Status: None   Collection Time    03/27/13  12:31 PM      Result Value Range Status   Specimen Description BLOOD LEFT HAND   Final   Special Requests BOTTLES DRAWN AEROBIC AND ANAEROBIC  8 CC   Final   Culture NO GROWTH 1 DAY   Final   Report Status PENDING   Incomplete  CULTURE, BLOOD (ROUTINE X 2)     Status: None   Collection Time    03/27/13 12:31 PM      Result Value Range Status   Specimen Description LEFT ANTECUBITAL   Final   Special Requests BOTTLES DRAWN AEROBIC AND ANAEROBIC 15 CC   Final   Culture NO GROWTH 1 DAY   Final   Report Status PENDING   Incomplete     Studies: Dg Chest 2 View  03/27/2013   CLINICAL DATA:  Cough, shortness of breath  EXAM: CHEST  2 VIEW  COMPARISON:  03/04/2007  FINDINGS: Enlargement of cardiac silhouette.  Mediastinal contours normal.  Mild pulmonary vascular congestion.  Extensive airspace infiltrate identified in both the left upper and left lower lobes most consistent with pneumonia.  Central peribronchial thickening noted.  Right lung grossly clear.  Question component of pleural effusion at lateral left lung base.  No pneumothorax or acute osseous findings.  IMPRESSION: Enlargement of cardiac silhouette.  Extensive infiltrate in left upper and left lower lobes most consistent with pneumonia.  Question small left pleural effusion.   Electronically Signed   By: Ulyses Southward M.D.   On: 03/27/2013 10:52    Scheduled Meds: . amLODipine  10 mg Oral Daily  . heparin  5,000 Units Subcutaneous Q8H  . hydrALAZINE  50 mg Oral BID  . labetalol  400 mg Oral BID  . lanthanum  1,000 mg Oral TID WC  . lisinopril  40 mg Oral Daily  . omega-3 acid ethyl esters  2,000 mg Oral BID  . piperacillin-tazobactam (ZOSYN)  IV  2.25 g Intravenous Q8H  . simvastatin  20 mg Oral q1800  . sodium bicarbonate  650 mg Oral BID  . sodium chloride  3 mL Intravenous Q12H  . sodium chloride  3 mL Intravenous Q12H  . vancomycin  1,000 mg Intravenous Q M,W,F-HD   Continuous Infusions:   Active Problems:   HCAP  (healthcare-associated pneumonia)   ESRD needing dialysis   Anemia   HTN (hypertension)   Diabetes type 2, controlled    Time spent: 35 minutes    [name]  [facility] Pager [phone]. If 7PM-7AM, please contact night-coverage at [email or web address], password [facility] 03/28/2013, 1:33 PM  LOS: 1 day

## 2013-03-28 NOTE — Progress Notes (Signed)
Patient still has increased blood pressure of 188/87 after taking his dose of clonidine 0.2 mg PO.  Patient heart rate has increased to 140's to 150's with his temperature rising again to over 102.  On-call MD made aware will have first shift nurse follow-up.

## 2013-03-28 NOTE — H&P (Signed)
[facility]  History and Physical  [name] XLK:[id number] DOB: 1981/02/28 DOA: 03/27/2013  Referring physician:  PCP: Josue Hector, MD  Specialists:  Chief Complaint: Cough/shortness of breath  HPI: Justin Ortega is a 32 y.o. male with a past medical history of end-stage renal disease, on hemodialysis Tuesdays Thursdays and Saturdays, who presents emergency department with complaints of increasing cough and shortness of breath. He states symptoms started approximately 2 weeks ago, with coughing congestion which did not improve over time. In the last 3-4 days, he had increasing cough, with clear sputum production, generalized weakness, malaise, shortness of breath and feeling sick overall. He states yesterday missing dialysis today and unable to leave his home due to his illness. He presented to the emergency department today where a chest x-ray revealed extensive airspace infiltrate in both left upper and left lower lobes consistent with pneumonia. He denies sick contacts or recent travel. He has not been on recent antibiotic therapy. in the emergency room he was mentating well with stable vital signs. I discussed case with his nephrologist made him aware of patient's admission and need for hemodialysis.  Review of Systems: The patient denies anorexia, fever, weight loss,, vision loss, decreased hearing, hoarseness, chest pain, syncope, peripheral edema, balance deficits, hemoptysis, abdominal pain, melena, hematochezia, severe indigestion/heartburn, hematuria, incontinence, genital sores, muscle weakness, suspicious skin lesions, transient blindness, difficulty walking, depression, unusual weight change, abnormal bleeding, enlarged lymph nodes, angioedema, and breast masses.  Past Medical History   Diagnosis  Date   .  Renal disorder    .  Blind in both eyes    .  Hypertension     Past Surgical History   Procedure  Laterality  Date   .  Eye surgery     .  Dialysis fistula  creation      Social History: reports that he has been smoking Cigarettes. He has been smoking about 0.25 packs per day. He does not have any smokeless tobacco history on file. He reports that he does not drink alcohol or use illicit drugs.  Allergies   Allergen  Reactions   .  Bystolic [Nebivolol Hcl]  Palpitations    Family History   Problem  Relation  Age of Onset   .  Diabetes  Mother    .  Hypertension  Mother    .  Diabetes  Father    .  Hypertension  Father    .  Heart disease  Mother      Died ([facility]) on dialysis   .  Heart disease  Father    .  Kidney failure  Father    .  Kidney failure  Mother     Prior to Admission medications   Medication  Sig  Start Date  End Date  Taking?  Authorizing Provider   acetaminophen (TYLENOL) 500 MG tablet  Take 500 mg by mouth every 6 (six) hours as needed for pain.    Yes  Historical Provider, MD   amLODipine (NORVASC) 5 MG tablet  Take 10 mg by mouth daily.    Yes  Historical Provider, MD   diphenhydrAMINE (BENADRYL) 25 mg capsule  Take 25 mg by mouth every 6 (six) hours as needed for allergies.    Yes  Historical Provider, MD   hydrALAZINE (APRESOLINE) 50 MG tablet  Take 50 mg by mouth 2 (two) times daily.    Yes  Historical Provider, MD   labetalol (NORMODYNE) 300 MG tablet  Take 300 mg  by mouth 2 (two) times daily.    Yes  Historical Provider, MD   lanthanum (FOSRENOL) 500 MG chewable tablet  Chew 1,000 mg by mouth. Take 2 tabs w/ every meal & 1 w/ every snack.    Yes  Historical Provider, MD   lisinopril (PRINIVIL,ZESTRIL) 40 MG tablet  Take 40 mg by mouth daily.    Yes  Historical Provider, MD   Omega-3 Fatty Acids (FISH OIL) 1000 MG CAPS  Take 2,000 mg by mouth 2 (two) times daily.    Yes  Historical Provider, MD   pravastatin (PRAVACHOL) 40 MG tablet  Take 40 mg by mouth daily.    Yes  Historical Provider, MD   sodium bicarbonate 650 MG tablet  Take 650 mg by mouth 2 (two) times daily.    Yes  Historical Provider, MD   Physical Exam:   Filed Vitals:    03/27/13 1311   BP:  179/77   Pulse:  93   Temp:  99.8 F (37.7 C)   Resp:  21    General: Well-nourished well-developed male, in no acute distress. He is awake alert and oriented x3  Eyes: Pupils are equal round reactive to light extraocular movement is intact  Neck: Neck supple symmetrical no jugular venous distention or carotid bruit  Cardiovascular: Regular rate and rhythm normal S1-S2 no murmurs rubs or gallops  Respiratory: Patient having left sided crackles, coarse respiratory sounds, normal respiratory effort. Right lung is clear  Abdomen: Soft nontender nondistended positive bowel sounds  Skin: No rashes or lesions  Musculoskeletal: Present range of motion to all extremities  Psychiatric: Awake alert and oriented x3  Neurologic: Cranial nerves II through XII grossly intact no alteration to sensation global 5 of 5 muscle strength Labs on Admission:  Basic Metabolic Panel:   Recent Labs  Lab  03/27/13 1057   NA  137   K  4.9   CL  94*   CO2  26   GLUCOSE  143*   BUN  66*   CREATININE  14.38*   CALCIUM  9.4    Liver Function Tests:   Recent Labs  Lab  03/27/13 1057   AST  18   ALT  17   ALKPHOS  82   BILITOT  0.4   PROT  6.9   ALBUMIN  3.3*    No results found for this basename: LIPASE, AMYLASE, in the last 168 hours  No results found for this basename: AMMONIA, in the last 168 hours  CBC:   Recent Labs  Lab  03/27/13 1057   WBC  4.9   NEUTROABS  4.2   HGB  9.4*   HCT  27.9*   MCV  90.9   PLT  117*    Cardiac Enzymes:  No results found for this basename: CKTOTAL, CKMB, CKMBINDEX, TROPONINI, in the last 168 hours  BNP (last 3 results)  No results found for this basename: PROBNP, in the last 8760 hours  CBG:  No results found for this basename: GLUCAP, in the last 168 hours  Radiological Exams on Admission:  Dg Chest 2 View  03/27/2013 CLINICAL DATA: Cough, shortness of breath EXAM: CHEST 2 VIEW COMPARISON: 03/04/2007 FINDINGS:  Enlargement of cardiac silhouette. Mediastinal contours normal. Mild pulmonary vascular congestion. Extensive airspace infiltrate identified in both the left upper and left lower lobes most consistent with pneumonia. Central peribronchial thickening noted. Right lung grossly clear. Question component of pleural effusion at lateral left lung base. No  pneumothorax or acute osseous findings. IMPRESSION: Enlargement of cardiac silhouette. Extensive infiltrate in left upper and left lower lobes most consistent with pneumonia. Question small left pleural effusion. Electronically Signed By: Ulyses Southward M.D. On: 03/27/2013 10:52   EKG: Independently reviewed.  Assessment/Plan  Active Problems:  HCAP (healthcare-associated pneumonia)  ESRD needing dialysis  Anemia  HTN (hypertension)  Diabetes type 2, controlled   1. Healthcare associated pneumonia. Mr. Climer is a dialysis patient, undergoing HD on Tuesday Thursdays and Saturdays, who presents with complaints of cough shortness of breath, imaging studies today showed the development of extensive airspace disease involving left lung. Will start patient on broad spectrum antibiotic therapy with IV vancomycin and Zosyn. Provide supportive care, followup on blood cultures. At the time of admission he is hemodynamically stable, satting upper 90s on 2 L supplemental oxygen. 2. Acute respiratory failure. Evidence by an O2 sat of 89% on room air, patient requiring supplemental oxygen, likely secondary to healthcare associated pneumonia. Starting broad-spectrum empiric antibiotic therapy, provide supportive care, supplemental oxygen 3. End-stage renal disease. Case discussed with his nephrologist, patient to undergo hemodialysis today as he missed his HD session yesterday. 4. Hypertension. Patient on multiple antihypertensive agents, will continue his home regimen with lisinopril, hydralazine, labetalol, Norvasc. 5. Dyslipidemia. Continue statin therapy 6. Nutrition.  Renal diet 7. DVT prophylaxis. Subcutaneous heparin Code Status: Full code  Family Communication: Plan discussed with family members present at bedside  Disposition Plan: Will admit patient to telemetry, start broad-spectrum empiric IV antibiotic therapy, patient to receive hemodialysis during this hospitalization  Time spent: 70 min  [name]  [facility]  Pager [phone]  If 7PM-7AM, please contact night-coverage  [email or web address]  Password [email or web address]  03/27/2013, 1:53 PM

## 2013-03-28 NOTE — Progress Notes (Signed)
Subjective: Interval History: has no complaint of cough. His breathing is better. He denies any nausea vomiting. He is still sputum production. Patient denies any chest pain..  Objective: Vital signs in last 24 hours: Temp:  [98.9 F (37.2 C)-102.9 F (39.4 C)] 102.9 F (39.4 C) (11/22 0619) Pulse Rate:  [83-147] 147 (11/22 0619) Resp:  [18-22] 20 (11/22 0619) BP: (150-213)/(71-103) 188/87 mmHg (11/22 0619) SpO2:  [89 %-94 %] 93 % (11/22 0619) FiO2 (%):  [2 %] 2 % (11/21 1412) Weight:  [99.338 kg (219 lb)-102.1 kg (225 lb 1.4 oz)] 102.1 kg (225 lb 1.4 oz) (11/22 1914) Weight change:   Intake/Output from previous day: 11/21 0701 - 11/22 0700 In: 660 [P.O.:360; IV Piggyback:300] Out: -  Intake/Output this shift:    General appearance: alert, cooperative and no distress Resp: diminished breath sounds posterior - left Cardio: regular rate and rhythm, S1, S2 normal, no murmur, click, rub or gallop GI: soft, non-tender; bowel sounds normal; no masses,  no organomegaly Extremities: extremities normal, atraumatic, no cyanosis or edema  Lab Results:  Recent Labs  03/27/13 1057 03/28/13 0632  WBC 4.9 4.4  HGB 9.4* 10.4*  HCT 27.9* 31.5*  PLT 117* 123*   BMET:  Recent Labs  03/27/13 1057 03/28/13 0632  NA 137 137  K 4.9 4.8  CL 94* 92*  CO2 26 29  GLUCOSE 143* 125*  BUN 66* 34*  CREATININE 14.38* 8.79*  CALCIUM 9.4 9.6   No results found for this basename: PTH,  in the last 72 hours Iron Studies: No results found for this basename: IRON, TIBC, TRANSFERRIN, FERRITIN,  in the last 72 hours  Studies/Results: Dg Chest 2 View  03/27/2013   CLINICAL DATA:  Cough, shortness of breath  EXAM: CHEST  2 VIEW  COMPARISON:  03/04/2007  FINDINGS: Enlargement of cardiac silhouette.  Mediastinal contours normal.  Mild pulmonary vascular congestion.  Extensive airspace infiltrate identified in both the left upper and left lower lobes most consistent with pneumonia.  Central  peribronchial thickening noted.  Right lung grossly clear.  Question component of pleural effusion at lateral left lung base.  No pneumothorax or acute osseous findings.  IMPRESSION: Enlargement of cardiac silhouette.  Extensive infiltrate in left upper and left lower lobes most consistent with pneumonia.  Question small left pleural effusion.   Electronically Signed   By: Ulyses Southward M.D.   On: 03/27/2013 10:52    I have reviewed the patient's current medications.  Assessment/Plan: Problem #1 end-stage renal disease is status post hemodialysis yesterday. His BUN and creatinine is was in acceptable range normal potassium. Problem #2 pneumonia he is on antibiotics. He is slightly feeling better. Problem #3 hypertension his blood pressure seems to be high. Patient also has some sinus tachycardia. Presently patient is also on labetalol. Problem #4 anemia his hemoglobin and hematocrit seems to be stable. Patient is on Epogen. Problem #5 diabetes. Problem #6 diabetic retinopathy Problem #7 metabolic bone disease . His calcium and phosphorus is was in acceptable range and patient is on a binder. Plan: We'll continue his present management We'll check his basic metabolic panel and CBC in the morning. We'll increase his labetalol to 400 mg by mouth twice a day.   LOS: 1 day   Tylene Quashie S 03/28/2013,8:58 AM

## 2013-03-29 ENCOUNTER — Inpatient Hospital Stay (HOSPITAL_COMMUNITY): Payer: Medicare Other

## 2013-03-29 DIAGNOSIS — I4892 Unspecified atrial flutter: Secondary | ICD-10-CM

## 2013-03-29 DIAGNOSIS — I959 Hypotension, unspecified: Secondary | ICD-10-CM

## 2013-03-29 DIAGNOSIS — I498 Other specified cardiac arrhythmias: Secondary | ICD-10-CM

## 2013-03-29 DIAGNOSIS — E860 Dehydration: Secondary | ICD-10-CM

## 2013-03-29 LAB — MRSA PCR SCREENING: MRSA by PCR: NEGATIVE

## 2013-03-29 LAB — BASIC METABOLIC PANEL
BUN: 51 mg/dL — ABNORMAL HIGH (ref 6–23)
CO2: 28 mEq/L (ref 19–32)
GFR calc Af Amer: 6 mL/min — ABNORMAL LOW (ref >90)
GFR calc non Af Amer: 5 mL/min — ABNORMAL LOW (ref >90)
Potassium: 4.1 mEq/L (ref 3.5–5.1)
Sodium: 133 mEq/L — ABNORMAL LOW (ref 135–145)

## 2013-03-29 LAB — CBC
Hemoglobin: 9.2 g/dL — ABNORMAL LOW (ref 13.0–17.0)
MCH: 30.2 pg (ref 26.0–34.0)
MCHC: 33.1 g/dL (ref 30.0–36.0)
RBC: 3.05 MIL/uL — ABNORMAL LOW (ref 4.22–5.81)

## 2013-03-29 LAB — TSH: TSH: 0.845 u[IU]/mL (ref 0.350–4.500)

## 2013-03-29 LAB — TROPONIN I: Troponin I: <0.3 ng/mL (ref <0.30)

## 2013-03-29 MED ORDER — ADENOSINE 6 MG/2ML IV SOLN
INTRAVENOUS | Status: AC
  Filled 2013-03-29: qty 4

## 2013-03-29 MED ORDER — DILTIAZEM HCL 100 MG IV SOLR
5.0000 mg/h | INTRAVENOUS | Status: DC
  Administered 2013-03-29: 5 mg/h via INTRAVENOUS
  Administered 2013-03-30: 10 mg/h via INTRAVENOUS

## 2013-03-29 MED ORDER — METOPROLOL TARTRATE 1 MG/ML IV SOLN
5.0000 mg | Freq: Once | INTRAVENOUS | Status: AC
  Administered 2013-03-29: 5 mg via INTRAVENOUS
  Filled 2013-03-29: qty 5

## 2013-03-29 MED ORDER — LANTHANUM CARBONATE 500 MG PO CHEW
500.0000 mg | CHEWABLE_TABLET | Freq: Two times a day (BID) | ORAL | Status: DC | PRN
  Administered 2013-03-30: 500 mg via ORAL
  Filled 2013-03-29: qty 1

## 2013-03-29 MED ORDER — ADENOSINE 6 MG/2ML IV SOLN
6.0000 mg | Freq: Once | INTRAVENOUS | Status: AC
  Administered 2013-03-29: 6 mg via INTRAVENOUS
  Filled 2013-03-29: qty 2

## 2013-03-29 MED ORDER — DILTIAZEM HCL 30 MG PO TABS
30.0000 mg | ORAL_TABLET | Freq: Once | ORAL | Status: AC
  Administered 2013-03-29: 30 mg via ORAL
  Filled 2013-03-29: qty 1

## 2013-03-29 MED ORDER — SODIUM CHLORIDE 0.9 % IV BOLUS (SEPSIS)
1000.0000 mL | Freq: Once | INTRAVENOUS | Status: AC
  Administered 2013-03-29: 1000 mL via INTRAVENOUS

## 2013-03-29 MED ORDER — ADENOSINE 6 MG/2ML IV SOLN
12.0000 mg | Freq: Once | INTRAVENOUS | Status: AC
  Administered 2013-03-29: 12 mg via INTRAVENOUS

## 2013-03-29 MED ORDER — SODIUM CHLORIDE 0.9 % IV SOLN
INTRAVENOUS | Status: DC
  Administered 2013-03-29 – 2013-03-30 (×3): via INTRAVENOUS

## 2013-03-29 MED ORDER — PIPERACILLIN-TAZOBACTAM IN DEX 2-0.25 GM/50ML IV SOLN
INTRAVENOUS | Status: AC
  Filled 2013-03-29: qty 100

## 2013-03-29 MED ORDER — ASPIRIN 325 MG PO TABS
325.0000 mg | ORAL_TABLET | Freq: Every day | ORAL | Status: DC
  Administered 2013-03-29 – 2013-03-31 (×3): 325 mg via ORAL
  Filled 2013-03-29 (×3): qty 1

## 2013-03-29 NOTE — Progress Notes (Signed)
MD paged regarding abnormal VS, BP medications held at this time, until ok'ed with MD.  Pt has no c/o SOB.  Will continue to monitor.

## 2013-03-29 NOTE — Progress Notes (Signed)
Pt transported to ICU-8, report given at bedside to [facility] C.  Pt stable at time of transport, HR remains in 150's.  Pt and his visitor are aware of transfer plan.

## 2013-03-29 NOTE — Progress Notes (Signed)
Continues to run sinus tach around 150 . Afebrile. Paged hospitalist to notify of continued elevated heart rate.

## 2013-03-29 NOTE — Progress Notes (Addendum)
Pt notified of critical EKG result, stat EKG troponin ordered.  Pt is stable, no c/o chest pain, N/V, SOB.

## 2013-03-29 NOTE — Progress Notes (Signed)
Justin Ortega  MRN: [id number]  DOB/AGE: 1980/10/14 32 y.o.  Primary Care Physician:NYLAND,LEONARD [name], MD  Admit date: 03/27/2013  Chief Complaint:  Chief Complaint  Patient presents with  . Shortness of Breath  . Cough    S-Pt presented on  03/27/2013 with  Chief Complaint  Patient presents with  . Shortness of Breath  . Cough  .    Pt today feels better.    Pt says his breathing is better.   Pt wants to have his binders orders changed to include with snacks as well.  Meds . heparin  5,000 Units Subcutaneous Q8H  . hydrALAZINE  50 mg Oral BID  . labetalol  400 mg Oral BID  . lanthanum  1,000 mg Oral TID WC  . lisinopril  40 mg Oral Daily  . omega-3 acid ethyl esters  2,000 mg Oral BID  . piperacillin-tazobactam (ZOSYN)  IV  2.25 g Intravenous Q8H  . simvastatin  20 mg Oral q1800  . sodium bicarbonate  650 mg Oral BID  . sodium chloride  3 mL Intravenous Q12H  . sodium chloride  3 mL Intravenous Q12H  . vancomycin  1,000 mg Intravenous Q M,W,F-HD         Physical Exam: Vital signs in last 24 hours: Temp:  [98.5 F (36.9 C)-100 F (37.8 C)] 99 F (37.2 C) (11/23 0527) Pulse Rate:  [144-157] 154 (11/23 0527) Resp:  [20-24] 20 (11/23 0527) BP: (124-150)/(81-97) 124/81 mmHg (11/23 0527) SpO2:  [87 %-97 %] 94 % (11/23 0527) Weight change:  Last BM Date: 03/26/13  Intake/Output from previous day: 11/22 0701 - 11/23 0700 In: 363 [P.O.:360; I.V.:3] Out: -      Physical Exam: General- pt is awake,alert, oriented to time place and person Resp- No acute REsp distress, CTA B/L NO Rhonchi CVS- S1S2 regular in rate and rhythm GIT- BS+, soft, NT, ND EXT- NO LE Edema, Cyanosis Access -left AVf + Thrill   Lab Results: CBC  Recent Labs  03/28/13 0632 03/29/13 0534  WBC 4.4 2.8*  HGB 10.4* 9.2*  HCT 31.5* 27.8*  PLT 123* 114*    BMET  Recent Labs  03/28/13 0632 03/29/13 0534  NA 137 133*  K 4.8 4.1  CL 92* 91*  CO2 29 28  GLUCOSE 125*  168*  BUN 34* 51*  CREATININE 8.79* 11.47*  CALCIUM 9.6 9.1    MICRO Recent Results (from the past 240 hour(s))  CULTURE, BLOOD (ROUTINE X 2)     Status: None   Collection Time    03/27/13 12:31 PM      Result Value Range Status   Specimen Description BLOOD LEFT HAND   Final   Special Requests BOTTLES DRAWN AEROBIC AND ANAEROBIC  8 CC   Final   Culture NO GROWTH 2 DAYS   Final   Report Status PENDING   Incomplete  CULTURE, BLOOD (ROUTINE X 2)     Status: None   Collection Time    03/27/13 12:31 PM      Result Value Range Status   Specimen Description LEFT ANTECUBITAL   Final   Special Requests BOTTLES DRAWN AEROBIC AND ANAEROBIC 15 CC   Final   Culture NO GROWTH 2 DAYS   Final   Report Status PENDING   Incomplete      Lab Results  Component Value Date   CALCIUM 9.1 03/29/2013   PHOS 4.3 03/28/2013          Impression: 1)Renal  ESRD  on HD Pt last dialyzed on Friday NO need of HD today Will dialyze on Monday   2)HTN Target Organ damage  CKD  Medication- On RAS blockers-  On Alpha and beta Blockers  On Vasodilators-   3)Anemia HGb at goal (9--11) GI work up Colonoscopy  4)CKD Mineral-Bone Disorder Phosphorus at goal.  on Binders  5)ID-admitted with HCAP On ABX Primary MD following  6)Electrolytes Normokalemic Hyponatremic Sec to ESRD   7)Acid base Co2 at goal     Plan:  Will change binders Will dialyze in am       Dina Mobley S 03/29/2013, 7:31 AM

## 2013-03-29 NOTE — Progress Notes (Signed)
[facility] PROGRESS NOTE  Justin Ortega:[id number] DOB: November 18, 1980 DOA: 03/27/2013 PCP: Josue Hector, MD  Assessment/Plan: 1. Healthcare associated pneumonia. Chest x-ray showing extensive airspace infiltrate involving left upper and left lower lobes. He is a hemodialysis patient for which I have started broad-spectrum empiric IV antibiotic therapy with Zosyn and vancomycin. From a respiratory standpoint he seems improved as CXR shows improving infiltrates. However he remains tachycardic.  2. Tachycardia. This could be secondary to volume depletion/dehydration precipitated from infectious process. He reported having minimal PO intake in the days leading to his hospitalization. I have ordered a 1 L bolus of NS, monitor closely for fluid overload as he is a HD patient.  3. acute respiratory failure evidence by an O2 sats of 89% on room air requiring supplemental oxygen. Likely secondary to pneumonia. Continue supplemental oxygen and empiric IV antibiotic coverage. 4. End-stage renal disease. Nephrology consulted appreciate recommendations 5. Hypotension. He has a history of hypertension, for which antihypertensive agents were held this AM. I think this likely represents volume depletion/dehydration, as follow up imaging showing improvement to PNA. He is receiving IV fluids.  5. Nutrition. Renal diet  Code Status: Full code Family Communication: Plan discussed with family members present at bedside Disposition Plan: Continue broad-spectrum empiric IV antibiotic therapy, supportive care, supplemental oxygen   Consultants:  Nephrology  Antibiotics:  Vancomycin (started on 03/27/2013)  Zosyn (started on monthly 1 2014)  HPI/Subjective: Patient is a pleasant 32 year old gentleman with a past medical history of end-stage renal disease, who was admitted yesterday evening, presenting with increasing cough shortness of breath. Chest x-ray revealed extensive airspace infiltrate  in both left upper and left lower lobes consistent with pneumonia. He was started on broad-spectrum empiric IV antibiotic therapy with IV vancomycin and Zosyn for presumed healthcare associated pneumonia. From a respiratory standpint he he reports feeling better, CXR showing improvement. He remains however tachycardic, getting fluids.   Objective: Filed Vitals:   03/29/13 1404  BP: 114/72  Pulse: 149  Temp: 98.1 F (36.7 C)  Resp: 20    Intake/Output Summary (Last 24 hours) at 03/29/13 1505 Last data filed at 03/28/13 2152  Gross per 24 hour  Intake    123 ml  Output      0 ml  Net    123 ml   Filed Weights   03/27/13 1445 03/28/13 0619 03/29/13 1042  Weight: 100.8 kg (222 lb 3.6 oz) 102.1 kg (225 lb 1.4 oz) 99.2 kg (218 lb 11.1 oz)    Exam:   General:  Patient is in no acute distress, he is awake alert oriented reports feeling better today  Cardiovascular: Regular rate and rhythm normal S1-S2, tachycardic  Respiratory: Improved left sided cracklesi, right lung is clear to auscultation bilaterally  Abdomen: Soft nontender nondistended positive bowel  Musculoskeletal: Present range of motion to all extremities  Data Reviewed: Basic Metabolic Panel:  Recent Labs Lab 03/27/13 1057 03/28/13 0632 03/29/13 0534  NA 137 137 133*  K 4.9 4.8 4.1  CL 94* 92* 91*  CO2 26 29 28   GLUCOSE 143* 125* 168*  BUN 66* 34* 51*  CREATININE 14.38* 8.79* 11.47*  CALCIUM 9.4 9.6 9.1  PHOS  --  4.3  --    Liver Function Tests:  Recent Labs Lab 03/27/13 1057 03/28/13 0632  AST 18 18  ALT 17 18  ALKPHOS 82 76  BILITOT 0.4 0.4  PROT 6.9 6.9  ALBUMIN 3.3* 3.2*   No results found for this basename: LIPASE,  AMYLASE,  in the last 168 hours No results found for this basename: AMMONIA,  in the last 168 hours CBC:  Recent Labs Lab 03/27/13 1057 03/28/13 0632 03/29/13 0534  WBC 4.9 4.4 2.8*  NEUTROABS 4.2  --   --   HGB 9.4* 10.4* 9.2*  HCT 27.9* 31.5* 27.8*  MCV 90.9 92.1  91.1  PLT 117* 123* 114*   Cardiac Enzymes: No results found for this basename: CKTOTAL, CKMB, CKMBINDEX, TROPONINI,  in the last 168 hours BNP (last 3 results) No results found for this basename: PROBNP,  in the last 8760 hours CBG: No results found for this basename: GLUCAP,  in the last 168 hours  Recent Results (from the past 240 hour(s))  CULTURE, BLOOD (ROUTINE X 2)     Status: None   Collection Time    03/27/13 12:31 PM      Result Value Range Status   Specimen Description BLOOD LEFT HAND   Final   Special Requests BOTTLES DRAWN AEROBIC AND ANAEROBIC  8 CC   Final   Culture NO GROWTH 2 DAYS   Final   Report Status PENDING   Incomplete  CULTURE, BLOOD (ROUTINE X 2)     Status: None   Collection Time    03/27/13 12:31 PM      Result Value Range Status   Specimen Description LEFT ANTECUBITAL   Final   Special Requests BOTTLES DRAWN AEROBIC AND ANAEROBIC 15 CC   Final   Culture NO GROWTH 2 DAYS   Final   Report Status PENDING   Incomplete     Studies: Dg Chest 2 View  03/29/2013   CLINICAL DATA:  Pneumonia.  EXAM: CHEST  2 VIEW  COMPARISON:  March 27, 2013.  FINDINGS: Stable cardiomegaly. Right lung is clear. Left lower lobe airspace opacity is slightly improved consistent with pneumonia. No pneumothorax or pleural effusion is noted. Bony thorax is intact.  IMPRESSION: Slightly improved left lower lobe pneumonia.   Electronically Signed   By: Roque Lias M.D.   On: 03/29/2013 10:45    Scheduled Meds: . heparin  5,000 Units Subcutaneous Q8H  . hydrALAZINE  50 mg Oral BID  . labetalol  400 mg Oral BID  . lanthanum  1,000 mg Oral TID WC  . lisinopril  40 mg Oral Daily  . omega-3 acid ethyl esters  2,000 mg Oral BID  . piperacillin-tazobactam (ZOSYN)  IV  2.25 g Intravenous Q8H  . simvastatin  20 mg Oral q1800  . sodium bicarbonate  650 mg Oral BID  . sodium chloride  3 mL Intravenous Q12H  . sodium chloride  3 mL Intravenous Q12H  . vancomycin  1,000 mg Intravenous  Q M,W,F-HD   Continuous Infusions:   Active Problems:   HCAP (healthcare-associated pneumonia)   ESRD needing dialysis   Anemia   HTN (hypertension)   Diabetes type 2, controlled    Time spent: 35 minutes    Justin Ortega  [facility] Pager [phone]. If 7PM-7AM, please contact night-coverage at [email or web address], password [facility] 03/29/2013, 3:05 PM  LOS: 2 days

## 2013-03-29 NOTE — Progress Notes (Signed)
Followup progress note  Despite administering IV fluids, patient continued to be in sinus tachycardia having heart rates in the 130s to 150s. I obtained an EKG this afternoon which read acute MI. I immediately evaluated patient who denied chest pain, shortness of breath, diaphoresis, nausea or vomiting or even abdominal discomfort. Patient stated feeling same and was actually about to have his dinner. I spoke with Dr. Tresa Endo of cardiology at [facility] asking him to review EKG for a second opinion. Dr. Tresa Endo did not feel that this represented myocardial infarction. We repeated his EKG which showed a flutter versus SVT. He was administered 5 mg of IV Lopressor which was ineffective in controlling heart rates. I transferred patient to the intensive care unit and administered first 6 mg of IV adenosine push followed by 12 mg of adenosine push without any improvement. Patient now to be started on a Cardizem drip, plan to administer 10 mg IV bolus, titrate infusion rate overnight. Patient otherwise remains hemodynamically stable, with a last blood pressure 129/83. He is mentating well, denies chest pain. Meanwhile he was administered Aspirin 325mg  PO as troponins are being cycled.   45 minutes spent face to face encounter

## 2013-03-30 ENCOUNTER — Other Ambulatory Visit: Payer: Self-pay

## 2013-03-30 DIAGNOSIS — I4891 Unspecified atrial fibrillation: Secondary | ICD-10-CM

## 2013-03-30 DIAGNOSIS — D649 Anemia, unspecified: Secondary | ICD-10-CM

## 2013-03-30 LAB — TROPONIN I
Troponin I: <0.3 ng/mL (ref <0.30)
Troponin I: <0.3 ng/mL (ref <0.30)

## 2013-03-30 LAB — BASIC METABOLIC PANEL
BUN: 59 mg/dL — ABNORMAL HIGH (ref 6–23)
Calcium: 8.7 mg/dL (ref 8.4–10.5)
Chloride: 96 mEq/L (ref 96–112)
GFR calc Af Amer: 5 mL/min — ABNORMAL LOW (ref >90)
Potassium: 4.6 mEq/L (ref 3.5–5.1)
Sodium: 137 mEq/L (ref 135–145)

## 2013-03-30 LAB — CBC
HCT: 26.4 % — ABNORMAL LOW (ref 39.0–52.0)
MCH: 30.4 pg (ref 26.0–34.0)
MCHC: 33.3 g/dL (ref 30.0–36.0)
Platelets: 87 10*3/uL — ABNORMAL LOW (ref 150–400)
RDW: 13.1 % (ref 11.5–15.5)
WBC: 2.5 10*3/uL — ABNORMAL LOW (ref 4.0–10.5)

## 2013-03-30 MED ORDER — HYDRALAZINE HCL 25 MG PO TABS
50.0000 mg | ORAL_TABLET | Freq: Two times a day (BID) | ORAL | Status: DC
  Administered 2013-03-30 – 2013-03-31 (×3): 50 mg via ORAL
  Filled 2013-03-30 (×3): qty 2

## 2013-03-30 MED ORDER — PIPERACILLIN-TAZOBACTAM IN DEX 2-0.25 GM/50ML IV SOLN
INTRAVENOUS | Status: AC
  Filled 2013-03-30: qty 100

## 2013-03-30 MED ORDER — DILTIAZEM HCL ER COATED BEADS 240 MG PO CP24
240.0000 mg | ORAL_CAPSULE | Freq: Every day | ORAL | Status: DC
  Administered 2013-03-30: 240 mg via ORAL
  Filled 2013-03-30: qty 1

## 2013-03-30 MED ORDER — PIPERACILLIN-TAZOBACTAM IN DEX 2-0.25 GM/50ML IV SOLN
2.2500 g | Freq: Three times a day (TID) | INTRAVENOUS | Status: DC
  Administered 2013-03-30 – 2013-03-31 (×3): 2.25 g via INTRAVENOUS
  Filled 2013-03-30 (×7): qty 50

## 2013-03-30 MED ORDER — DILTIAZEM HCL 60 MG PO TABS
60.0000 mg | ORAL_TABLET | Freq: Two times a day (BID) | ORAL | Status: DC
  Administered 2013-03-31: 60 mg via ORAL
  Filled 2013-03-30: qty 1

## 2013-03-30 MED ORDER — LISINOPRIL 10 MG PO TABS
40.0000 mg | ORAL_TABLET | Freq: Every day | ORAL | Status: DC
  Administered 2013-03-30 – 2013-03-31 (×2): 40 mg via ORAL
  Filled 2013-03-30 (×2): qty 4

## 2013-03-30 MED ORDER — AMLODIPINE BESYLATE 5 MG PO TABS: 5.0000 mg | ORAL_TABLET | Freq: Every day | ORAL | Status: DC

## 2013-03-30 MED ORDER — EPOETIN ALFA 10000 UNIT/ML IJ SOLN
10000.0000 [IU] | INTRAMUSCULAR | Status: DC
  Administered 2013-03-30: 10000 [IU] via INTRAVENOUS
  Filled 2013-03-30 (×3): qty 1

## 2013-03-30 MED ORDER — ATORVASTATIN CALCIUM 10 MG PO TABS
10.0000 mg | ORAL_TABLET | Freq: Every day | ORAL | Status: DC
  Administered 2013-03-30: 10 mg via ORAL
  Filled 2013-03-30: qty 1

## 2013-03-30 NOTE — Progress Notes (Signed)
UR chart review completed.  

## 2013-03-30 NOTE — Progress Notes (Signed)
Pt transferred to room 302 via wheelchair. Report called to RN

## 2013-03-30 NOTE — Consult Note (Signed)
The patient was seen and examined, and I agree with the assessment and plan as documented above, with modifications as noted below. Patient currently being treated for LLL pneumonia, and found to initially be in what appeared to be SVT, then 2:1 atrial flutter, and now in rate-controlled atrial fibrillation. His risk factors for the development of atrial fibrillation are CKD and HTN. His current episode was likely triggered by his pneumonia. IV adenosine did not cardiovert him and he was started on an IV diltiazem infusion, which has been converted to oral long-acting diltiazem. The patient denies chest pain and palpitations altogether, and feels that his breathing is markedly improved. His annual unadjusted ischemic stroke rate is very low, and thus anticoagulation is not indicated (CHADS-Vasc score of 1). Given that he is asymptomatic and he is hemodynamically stable, I would continue long-acting diltiazem for now. He may very well spontaneously convert to normal sinus rhythm with pneumonia resolution. If he does not, an outpatient cardioversion may be attempted if deemed indicated at that time. I will follow up on the results of the echocardiogram to evaluate for any form of structural heart disease, as well as to assess left atrial dimensions.

## 2013-03-30 NOTE — Progress Notes (Addendum)
Dr. Darl Householder made aware of patient's conversion to NSR at approximately 1315, HR remains in the 70-80's.  New verbal order received to change Diltiazem 240 mg 24 hour daily to Diltiazem 60 mg short acting BID.  Will place orders.

## 2013-03-30 NOTE — Progress Notes (Signed)
[facility] PROGRESS NOTE  Justin Ortega ZOX:[id number] DOB: 1981/02/17 DOA: 03/27/2013 PCP: Josue Hector, MD  Assessment/Plan: 1. Healthcare associated pneumonia. Chest x-ray showing extensive airspace infiltrate involving left upper and left lower lobes. He is a hemodialysis patient for which I have started broad-spectrum empiric IV antibiotic therapy with Zosyn and vancomycin. From a respiratory standpoint he seems improved as CXR shows improving infiltrates. However he remains tachycardic.  2. Atrial fibrillation/atrial flutter. New diagnosis, will check a transthoracic echocardiogram, discontinue IV Cardizem transition to oral Cardizem at 240 mg by mouth q. 24 hours. Of note he had 3 negative troponins overnight. Will consult cardiology for further recommendations. 3. acute respiratory failure evidence by an O2 sats of 89% on room air requiring supplemental oxygen. Likely secondary to pneumonia. Continue supplemental oxygen and empiric IV antibiotic coverage. 4. End-stage renal disease. Nephrology consulted appreciate recommendations 5. Hypotension. He has a history of hypertension, for which antihypertensive agents were held this AM. I think this likely represents volume depletion/dehydration, as follow up imaging showing improvement to PNA. He is receiving IV fluids.  5. Nutrition. Renal diet  Code Status: Full code Family Communication: Plan discussed with family members present at bedside Disposition Plan: Will transfer patient to telemetry today, stopping IV Cardizem started oral Cardizem at 40 mg by mouth daily   Consultants:  Nephrology  Antibiotics:  Vancomycin (started on 03/27/2013)  Zosyn (started on monthly 1 2014)  HPI/Subjective: Patient is a pleasant 32 year old gentleman with a past medical history of end-stage renal disease, who was admitted on 03/27/13 , presenting with increasing cough shortness of breath. Chest x-ray revealed extensive airspace  infiltrate in both left upper and left lower lobes consistent with pneumonia. He was started on broad-spectrum empiric IV antibiotic therapy with IV vancomycin and Zosyn for presumed healthcare associated pneumonia. From a respiratory standpint he he reports feeling better, CXR showing improvement.  Patient's EKG yesterday showing SVT vs atrial flutter, transferred to the ICU was administered 6 mg dose of adenosine followed by 12 mg of adenosine, which was ineffective and  placed on Cardizem drip. Overnight rates controlled, currently Cardizem drip running at 5 mg per hour having ventricular rates in the 70s to 80s. Patient states feeling better.   Objective: Filed Vitals:   03/30/13 0630  BP: 123/79  Pulse:   Temp:   Resp: 16    Intake/Output Summary (Last 24 hours) at 03/30/13 0734 Last data filed at 03/30/13 0654  Gross per 24 hour  Intake 2230.89 ml  Output      0 ml  Net 2230.89 ml   Filed Weights   03/28/13 0619 03/29/13 1042 03/30/13 0500  Weight: 102.1 kg (225 lb 1.4 oz) 99.2 kg (218 lb 11.1 oz) 101.8 kg (224 lb 6.9 oz)    Exam:   General:  Patient is in no acute distress, he is awake alert oriented reports feeling better today  Cardiovascular: Regular rate and rhythm normal S1-S2, tachycardic  Respiratory: Improved left sided cracklesi, right lung is clear to auscultation bilaterally  Abdomen: Soft nontender nondistended positive bowel  Musculoskeletal: Present range of motion to all extremities  Data Reviewed: Basic Metabolic Panel:  Recent Labs Lab 03/27/13 1057 03/28/13 0632 03/29/13 0534 03/30/13 0550  NA 137 137 133* 137  K 4.9 4.8 4.1 4.6  CL 94* 92* 91* 96  CO2 26 29 28 28   GLUCOSE 143* 125* 168* 134*  BUN 66* 34* 51* 59*  CREATININE 14.38* 8.79* 11.47* 13.18*  CALCIUM 9.4 9.6 9.1  8.7  PHOS  --  4.3  --   --    Liver Function Tests:  Recent Labs Lab 03/27/13 1057 03/28/13 0632  AST 18 18  ALT 17 18  ALKPHOS 82 76  BILITOT 0.4 0.4  PROT  6.9 6.9  ALBUMIN 3.3* 3.2*   No results found for this basename: LIPASE, AMYLASE,  in the last 168 hours No results found for this basename: AMMONIA,  in the last 168 hours CBC:  Recent Labs Lab 03/27/13 1057 03/28/13 0632 03/29/13 0534 03/30/13 0550  WBC 4.9 4.4 2.8* 2.5*  NEUTROABS 4.2  --   --   --   HGB 9.4* 10.4* 9.2* 8.8*  HCT 27.9* 31.5* 27.8* 26.4*  MCV 90.9 92.1 91.1 91.3  PLT 117* 123* 114* 87*   Cardiac Enzymes:  Recent Labs Lab 03/29/13 1744 03/29/13 2345 03/30/13 0553  TROPONINI <0.30 <0.30 <0.30   BNP (last 3 results) No results found for this basename: PROBNP,  in the last 8760 hours CBG: No results found for this basename: GLUCAP,  in the last 168 hours  Recent Results (from the past 240 hour(s))  CULTURE, BLOOD (ROUTINE X 2)     Status: None   Collection Time    03/27/13 12:31 PM      Result Value Range Status   Specimen Description BLOOD LEFT HAND   Final   Special Requests BOTTLES DRAWN AEROBIC AND ANAEROBIC  8 CC   Final   Culture NO GROWTH 2 DAYS   Final   Report Status PENDING   Incomplete  CULTURE, BLOOD (ROUTINE X 2)     Status: None   Collection Time    03/27/13 12:31 PM      Result Value Range Status   Specimen Description LEFT ANTECUBITAL   Final   Special Requests BOTTLES DRAWN AEROBIC AND ANAEROBIC 15 CC   Final   Culture NO GROWTH 2 DAYS   Final   Report Status PENDING   Incomplete  MRSA PCR SCREENING     Status: None   Collection Time    03/29/13  6:21 PM      Result Value Range Status   MRSA by PCR NEGATIVE  NEGATIVE Final   Comment:            The GeneXpert MRSA Assay (FDA     approved for NASAL specimens     only), is one component of a     comprehensive MRSA colonization     surveillance program. It is not     intended to diagnose MRSA     infection nor to guide or     monitor treatment for     MRSA infections.     Studies: Dg Chest 2 View  03/29/2013   CLINICAL DATA:  Pneumonia.  EXAM: CHEST  2 VIEW   COMPARISON:  March 27, 2013.  FINDINGS: Stable cardiomegaly. Right lung is clear. Left lower lobe airspace opacity is slightly improved consistent with pneumonia. No pneumothorax or pleural effusion is noted. Bony thorax is intact.  IMPRESSION: Slightly improved left lower lobe pneumonia.   Electronically Signed   By: Roque Lias M.D.   On: 03/29/2013 10:45    Scheduled Meds: . aspirin  325 mg Oral Daily  . diltiazem  240 mg Oral Daily  . heparin  5,000 Units Subcutaneous Q8H  . lanthanum  1,000 mg Oral TID WC  . omega-3 acid ethyl esters  2,000 mg Oral BID  . piperacillin-tazobactam (ZOSYN)  IV  2.25 g Intravenous Q8H  . simvastatin  20 mg Oral q1800  . sodium bicarbonate  650 mg Oral BID  . sodium chloride  3 mL Intravenous Q12H  . sodium chloride  3 mL Intravenous Q12H  . vancomycin  1,000 mg Intravenous Q M,W,F-HD   Continuous Infusions: . sodium chloride 20 mL/hr at 03/30/13 0600    Active Problems:   HCAP (healthcare-associated pneumonia)   ESRD needing dialysis   Anemia   HTN (hypertension)   Diabetes type 2, controlled    Time spent: 35 minutes    [name]  [facility] Pager [phone]. If 7PM-7AM, please contact night-coverage at [email or web address], password [facility] 03/30/2013, 7:34 AM  LOS: 3 days

## 2013-03-30 NOTE — Procedures (Signed)
   HEMODIALYSIS TREATMENT NOTE:  4 hour HD completed via left upper arm AVF.  Hgb trending down; EPO 10K u ordered by Dr. Kristian Covey.  Goal met:  Pt tolerated removal of 2 liters.  Ultrafiltration was interrupted for 15 minutes for c/o cramping.  Converted from rate controlled Afib to NSR 70s two hours into dialysis session.  Lisinopril and Hydralazine restarted by Dr. Vanessa Barbara for hypertension.  All blood was reinfused and hemostasis was achieved within 15 minutes.  Report given to Dagoberto Ligas, RN.  Zakaria Sedor L. Salle Brandle, RN, CDN

## 2013-03-30 NOTE — Consult Note (Signed)
CARDIOLOGY CONSULT NOTE   Patient ID: Justin Ortega MRN: [id number] DOB/AGE: Dec 07, 1980 32 y.o.  Admit Date: 03/27/2013 Referring Physician: PTH Primary Physician: Josue Hector, MD Consulting Cardiologist: Prentice Docker MD Primary Cardiologist:  New Reason for Consultation: New Onset Afib/fluttter with RVR  Clinical Summary Mr. Levene is a 32 y.o.blind, male admitted with HCAP, with known history of ESRD on hemodialysis. 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 He is on a transplant list at [facility], and has been cleared to proceed. His donor kidney is being provided by his wife, and they are awaiting her weight loss in order to do the transplant. He has been seen in the past by a cardiologist approximately one year ago for transplant pre-evaluation at [facility] . He is scheduled for a stress test and an echocardiogram on December 3 at [facility] in order to begin the process of scheduling surgery.      Patient states symptoms began about 3 weeks ago, with a head cold. He was seen by his primary care physician after failing over-the-counter treatment. The patient was having some cough and congestion at that point. He was started on a Z-Pak. The patient did well for approximately 24 hours, but symptoms worsened with associated shortness of breath. He was seen in the ER chest x-ray revealing enlargement of cardiac followup at. Extensive infiltrate in the upper and left lower lobes most consistent with pneumonia. There was a questionable left pleural effusion. He was started on antibiotics to include Levaquin and vancomycin along with Zosyn.    Unfortunately, on 03/27/2013, the patient began have a rapid heart rhythm, SVT, which was thought to be related to dehydration in the setting of infective process, fever, along with ongoing dialysis.. The  patient is given IV fluid bolus of normal saline. Heart rate did not improve, and he was noted to ICU. EKG revealed atrial fib with RVR, he was given IV metoprolol bolus of 5 mg x1. He was also given adenosine 6 mg followed by 12 mg without any improvement. The patient was given a 10 mg bolus of Cardizem, and began on a Cardizem drip. Was started on 325 mg and aspirin. The patient's heart rate is now better controlled but remains in atrial fibrillation in the 70s. The patient states he felt his heart rate going up approximately 4 days ago prior to admission.   Allergies  Allergen Reactions  . Bystolic [Nebivolol Hcl] Palpitations    Medications Scheduled Medications: . aspirin  325 mg Oral Daily  . diltiazem  240 mg Oral Daily  . heparin  5,000 Units Subcutaneous Q8H  . lanthanum  1,000 mg Oral TID WC  . omega-3 acid ethyl esters  2,000 mg Oral BID  . piperacillin-tazobactam (ZOSYN)  IV  2.25 g Intravenous Q8H  . simvastatin  20 mg Oral q1800  . sodium bicarbonate  650 mg Oral BID  . sodium chloride  3 mL Intravenous Q12H  . sodium chloride  3 mL Intravenous Q12H  . vancomycin  1,000 mg Intravenous Q M,W,F-HD     Infusions: . sodium chloride 20 mL/hr at 03/30/13 0600     PRN Medications:  sodium chloride, acetaminophen, acetaminophen, alum & mag hydroxide-simeth, benzonatate, feeding supplement (NEPRO CARB STEADY), heparin, heparin, heparin, lanthanum, lidocaine (PF), lidocaine-prilocaine, LORazepam, oxyCODONE, pentafluoroprop-tetrafluoroeth, sodium chloride   Past Medical History  Diagnosis Date  .  Renal disorder   . Blind in both eyes   . Hypertension     Past Surgical History  Procedure Laterality Date  . Eye surgery    . Dialysis fistula creation      Family History  Problem Relation Age of Onset  . Diabetes Mother   . Hypertension Mother   . Diabetes Father   . Hypertension Father   . Heart disease Mother     Died ([facility]) on dialysis  . Heart disease Father     . Kidney failure Father   . Kidney failure Mother     Social History Mr. Bazzano reports that he has been smoking Cigarettes.  He has been smoking about 0.25 packs per day. He does not have any smokeless tobacco history on file. Mr. Hixon reports that he does not drink alcohol.  Review of Systems Otherwise reviewed and negative except as outlined.  Physical Examination Blood pressure 123/79, pulse 88, temperature 97.5 F (36.4 C), temperature source Oral, resp. rate 16, height 5\' 8"  (1.727 m), weight 224 lb 6.9 oz (101.8 kg), SpO2 97.00%.  Intake/Output Summary (Last 24 hours) at 03/30/13 1002 Last data filed at 03/30/13 0852  Gross per 24 hour  Intake 2230.89 ml  Output      0 ml  Net 2230.89 ml    Telemetry:Atrial fibrillation rates in the 70's.   HEENT: Conjunctiva and lids normal, oropharynx clear with moist mucosa. Neck: Supple, no elevated JVP or carotid bruits, no thyromegaly. Lungs: Inspiratory rales and rhonchi. No coughing.  Cardiac: Iregular rate and rhythm, no S3 or significant systolic murmur, no pericardial rub. Abdomen: Soft, nontender, no hepatomegaly, bowel sounds present, no guarding or rebound. Extremities: No pitting edema, distal pulses 2+.Dialysis catheter in the right forearm. Skin: Warm and dry. Musculoskeletal: No kyphosis. Neuropsychiatric: Alert and oriented x3, affect grossly appropriate.  Prior Cardiac Testing/Procedures  Lab Results  Basic Metabolic Panel:  Recent Labs Lab 03/27/13 1057 03/28/13 0632 03/29/13 0534 03/30/13 0550  NA 137 137 133* 137  K 4.9 4.8 4.1 4.6  CL 94* 92* 91* 96  CO2 26 29 28 28   GLUCOSE 143* 125* 168* 134*  BUN 66* 34* 51* 59*  CREATININE 14.38* 8.79* 11.47* 13.18*  CALCIUM 9.4 9.6 9.1 8.7  PHOS  --  4.3  --   --     Liver Function Tests:  Recent Labs Lab 03/27/13 1057 03/28/13 0632  AST 18 18  ALT 17 18  ALKPHOS 82 76  BILITOT 0.4 0.4  PROT 6.9 6.9  ALBUMIN 3.3* 3.2*    CBC:  Recent Labs Lab  03/27/13 1057 03/28/13 0632 03/29/13 0534 03/30/13 0550  WBC 4.9 4.4 2.8* 2.5*  NEUTROABS 4.2  --   --   --   HGB 9.4* 10.4* 9.2* 8.8*  HCT 27.9* 31.5* 27.8* 26.4*  MCV 90.9 92.1 91.1 91.3  PLT 117* 123* 114* 87*    Cardiac Enzymes:  Recent Labs Lab 03/29/13 1744 03/29/13 2345 03/30/13 0553  TROPONINI <0.30 <0.30 <0.30     Radiology: Dg Chest 2 View  03/29/2013   CLINICAL DATA:  Pneumonia.  EXAM: CHEST  2 VIEW  COMPARISON:  March 27, 2013.  FINDINGS: Stable cardiomegaly. Right lung is clear. Left lower lobe airspace opacity is slightly improved consistent with pneumonia. No pneumothorax or pleural effusion is noted. Bony thorax is intact.  IMPRESSION: Slightly improved left lower lobe pneumonia.   Electronically Signed   By: Roque Lias M.D.   On: 03/29/2013 10:45  ECG: (03/29/2013) SVT rate of 170 bpm. T-wave inversion, inferior/laterally.            (03/30/2013) Atrial fibrilation rates in the 70's.     Impression and Recommendations:  1. New Onset Atrial fibrillation with RVR: Patient failed adenosine bolus, lopressor, and is now rate controlled after diltiazem gtt after bolus. He is now on PO diltiazem at 240 mg daily. He is on heparin through dialysis, but no daily regimen. Likely related to acute illness. Cardiac markers argue against ACS. Echocardiogram will be completed for evaluation of LV fx and LA size. Consider TEE/DCCV if he is not pharmacologically converted. CHADS Score 1 (Hypertension).   2. ESRD: Continues on dialysis. He is planned for renal transplant via [facility]. He has been cleared, and is due to undergo final screening next month to include echocardiogram and stress test. His wife was morbidly obese, and was advised to lose weight before donating her kidney. She has lost 75 lbs. Scheduled appts at [facility] in December.   3. Pneumonia: Followed by PTH with ongoing treatment with antibiotics.     Signed: Bettey Mare. Lyman Bishop NP [facility] 03/30/2013, 10:02 AM Co-Sign MD

## 2013-03-30 NOTE — Care Management Note (Addendum)
    Page 1 of 1   03/31/2013     12:03:24 PM   CARE MANAGEMENT NOTE 03/31/2013  Patient:  ALRICK, CUBBAGE   Account Number:  [id number]  Date Initiated:  03/30/2013  Documentation initiated by:  Sharrie Rothman  Subjective/Objective Assessment:   Pt admitted from home with a fib and possible pneumonia. Pt lives with his girlfriend and will return home at discharge. Pt receives dialysis T-TH-Sat at [facility] in [place]. Pt uses a cane for home use (pt is blind). Pt is fairly independent     Action/Plan:   with ADL's. No CM needs noted.   Anticipated DC Date:  04/01/2013   Anticipated DC Plan:  HOME/SELF CARE      DC Planning Services  CM consult      Choice offered to / List presented to:             Status of service:  Completed, signed off Medicare Important Message given?  YES (If response is "NO", the following Medicare IM given date fields will be blank) Date Medicare IM given:  03/31/2013 Date Additional Medicare IM given:    Discharge Disposition:  HOME/SELF CARE  Per UR Regulation:    If discussed at Long Length of Stay Meetings, dates discussed:    Comments:  03/31/13 1200 Arlyss Queen, RN BSN CM Pt discharged home today. No CM needs noted.  03/30/13 1510 Arlyss Queen, RN BSN CM

## 2013-03-30 NOTE — Progress Notes (Signed)
Justin Ortega  MRN: [id number]  DOB/AGE: 1981/04/30 32 y.o.  Primary Care Physician:NYLAND,LEONARD [name], MD  Admit date: 03/27/2013  Chief Complaint:  Chief Complaint  Patient presents with  . Shortness of Breath  . Cough    S-Pt presented on  03/27/2013 with  Chief Complaint  Patient presents with  . Shortness of Breath  . Cough  .   Pt today feels better.  pt seen on HD.  pt tolerating tx well  Meds . aspirin  325 mg Oral Daily  . atorvastatin  10 mg Oral q1800  . [START ON 03/31/2013] diltiazem  60 mg Oral BID  . epoetin (EPOGEN/PROCRIT) injection  10,000 Units Intravenous Q M,W,F-HD  . heparin  5,000 Units Subcutaneous Q8H  . hydrALAZINE  50 mg Oral BID  . lanthanum  1,000 mg Oral TID WC  . lisinopril  40 mg Oral Daily  . omega-3 acid ethyl esters  2,000 mg Oral BID  . piperacillin-tazobactam (ZOSYN)  IV  2.25 g Intravenous Q8H  . sodium bicarbonate  650 mg Oral BID  . sodium chloride  3 mL Intravenous Q12H  . sodium chloride  3 mL Intravenous Q12H  . vancomycin  1,000 mg Intravenous Q M,W,F-HD         Physical Exam: Vital signs in last 24 hours: Temp:  [97.5 F (36.4 C)-98 F (36.7 C)] 97.7 F (36.5 C) (11/24 1145) Pulse Rate:  [80-153] 82 (11/24 1515) Resp:  [0-25] 20 (11/24 1105) BP: (113-171)/(58-111) 168/89 mmHg (11/24 1515) SpO2:  [92 %-97 %] 97 % (11/24 1105) Weight:  [224 lb 6.9 oz (101.8 kg)-233 lb 14.5 oz (106.1 kg)] 233 lb 14.5 oz (106.1 kg) (11/24 1105) Weight change:  Last BM Date: 03/29/13  Intake/Output from previous day: 11/23 0701 - 11/24 0700 In: 2250.9 [P.O.:720; I.V.:1380.9; IV Piggyback:150] Out: -  Total I/O In: 600.2 [P.O.:240; I.V.:160.2; IV Piggyback:200] Out: -    Physical Exam: General- pt is awake,alert, oriented to time place and person Resp- No acute REsp distress, CTA B/L NO Rhonchi CVS- S1S2 regular in rate and rhythm GIT- BS+, soft, NT, ND EXT- NO LE Edema, Cyanosis Access -left AVf + Thrill   Lab  Results: CBC  Recent Labs  03/29/13 0534 03/30/13 0550  WBC 2.8* 2.5*  HGB 9.2* 8.8*  HCT 27.8* 26.4*  PLT 114* 87*    BMET  Recent Labs  03/29/13 0534 03/30/13 0550  NA 133* 137  K 4.1 4.6  CL 91* 96  CO2 28 28  GLUCOSE 168* 134*  BUN 51* 59*  CREATININE 11.47* 13.18*  CALCIUM 9.1 8.7    MICRO Recent Results (from the past 240 hour(s))  CULTURE, BLOOD (ROUTINE X 2)     Status: None   Collection Time    03/27/13 12:31 PM      Result Value Range Status   Specimen Description BLOOD LEFT HAND   Final   Special Requests BOTTLES DRAWN AEROBIC AND ANAEROBIC  8 CC   Final   Culture NO GROWTH 3 DAYS   Final   Report Status PENDING   Incomplete  CULTURE, BLOOD (ROUTINE X 2)     Status: None   Collection Time    03/27/13 12:31 PM      Result Value Range Status   Specimen Description LEFT ANTECUBITAL   Final   Special Requests BOTTLES DRAWN AEROBIC AND ANAEROBIC 15 CC   Final   Culture NO GROWTH 3 DAYS   Final   Report Status  PENDING   Incomplete  MRSA PCR SCREENING     Status: None   Collection Time    03/29/13  6:21 PM      Result Value Range Status   MRSA by PCR NEGATIVE  NEGATIVE Final   Comment:            The GeneXpert MRSA Assay (FDA     approved for NASAL specimens     only), is one component of a     comprehensive MRSA colonization     surveillance program. It is not     intended to diagnose MRSA     infection nor to guide or     monitor treatment for     MRSA infections.      Lab Results  Component Value Date   CALCIUM 8.7 03/30/2013   PHOS 4.3 03/28/2013          Impression: 1)Renal  ESRD on HD Pt last dialyzed on Friday Pt being dialyze today.  2)HTN Target Organ damage  CKD  Medication- On RAS blockers-  On Alpha and beta Blockers  On Vasodilators-   3)Anemia HGb at goal (9--11) GI work up Colonoscopy  4)CKD Mineral-Bone Disorder Phosphorus at goal.  on Binders  5)ID-admitted with HCAP On ABX Primary MD  following  6)Electrolytes Normokalemic Hyponatremic Sec to ESRD   7)Acid base Co2 at goal  8) CVS-SVT/ Afib On Cardizem    Plan:   Will continue current care.     Damara Klunder S 03/30/2013, 4:10 PM

## 2013-03-31 ENCOUNTER — Inpatient Hospital Stay (HOSPITAL_COMMUNITY)
Admission: EM | Admit: 2013-03-31 | Discharge: 2013-04-03 | DRG: 308 | Disposition: A | Discharge status: Home / Self Care | Payer: Medicare Other | Attending: Family Medicine | Admitting: Family Medicine

## 2013-03-31 ENCOUNTER — Emergency Department (HOSPITAL_COMMUNITY): Payer: Medicare Other

## 2013-03-31 ENCOUNTER — Encounter (HOSPITAL_COMMUNITY): Payer: Self-pay | Admitting: Emergency Medicine

## 2013-03-31 DIAGNOSIS — I4892 Unspecified atrial flutter: Secondary | ICD-10-CM | POA: Diagnosis present

## 2013-03-31 DIAGNOSIS — R000 Tachycardia, unspecified: Secondary | ICD-10-CM | POA: Diagnosis present

## 2013-03-31 DIAGNOSIS — D631 Anemia in chronic kidney disease: Secondary | ICD-10-CM | POA: Diagnosis present

## 2013-03-31 DIAGNOSIS — Z8249 Family history of ischemic heart disease and other diseases of the circulatory system: Secondary | ICD-10-CM

## 2013-03-31 DIAGNOSIS — D696 Thrombocytopenia, unspecified: Secondary | ICD-10-CM | POA: Diagnosis present

## 2013-03-31 DIAGNOSIS — D649 Anemia, unspecified: Secondary | ICD-10-CM

## 2013-03-31 DIAGNOSIS — I4891 Unspecified atrial fibrillation: Principal | ICD-10-CM | POA: Diagnosis present

## 2013-03-31 DIAGNOSIS — F17200 Nicotine dependence, unspecified, uncomplicated: Secondary | ICD-10-CM | POA: Diagnosis present

## 2013-03-31 DIAGNOSIS — Z992 Dependence on renal dialysis: Secondary | ICD-10-CM

## 2013-03-31 DIAGNOSIS — I120 Hypertensive chronic kidney disease with stage 5 chronic kidney disease or end stage renal disease: Secondary | ICD-10-CM | POA: Diagnosis present

## 2013-03-31 DIAGNOSIS — Z833 Family history of diabetes mellitus: Secondary | ICD-10-CM

## 2013-03-31 DIAGNOSIS — I10 Essential (primary) hypertension: Secondary | ICD-10-CM | POA: Diagnosis present

## 2013-03-31 DIAGNOSIS — E871 Hypo-osmolality and hyponatremia: Secondary | ICD-10-CM | POA: Diagnosis present

## 2013-03-31 DIAGNOSIS — R0989 Other specified symptoms and signs involving the circulatory and respiratory systems: Secondary | ICD-10-CM

## 2013-03-31 DIAGNOSIS — J189 Pneumonia, unspecified organism: Secondary | ICD-10-CM | POA: Diagnosis present

## 2013-03-31 DIAGNOSIS — E1139 Type 2 diabetes mellitus with other diabetic ophthalmic complication: Secondary | ICD-10-CM | POA: Diagnosis present

## 2013-03-31 DIAGNOSIS — N186 End stage renal disease: Secondary | ICD-10-CM | POA: Diagnosis present

## 2013-03-31 DIAGNOSIS — H543 Unqualified visual loss, both eyes: Secondary | ICD-10-CM | POA: Diagnosis present

## 2013-03-31 DIAGNOSIS — E11319 Type 2 diabetes mellitus with unspecified diabetic retinopathy without macular edema: Secondary | ICD-10-CM | POA: Diagnosis present

## 2013-03-31 LAB — CBC
HCT: 28.4 % — ABNORMAL LOW (ref 39.0–52.0)
Hemoglobin: 9.5 g/dL — ABNORMAL LOW (ref 13.0–17.0)
MCH: 30.4 pg (ref 26.0–34.0)
MCV: 90.7 fL (ref 78.0–100.0)
RBC: 3.13 MIL/uL — ABNORMAL LOW (ref 4.22–5.81)
RDW: 12.9 % (ref 11.5–15.5)
WBC: 4.5 10*3/uL (ref 4.0–10.5)

## 2013-03-31 LAB — CBC WITH DIFFERENTIAL/PLATELET
Eosinophils Absolute: 0.2 10*3/uL (ref 0.0–0.7)
Eosinophils Relative: 4 % (ref 0–5)
HCT: 31 % — ABNORMAL LOW (ref 39.0–52.0)
Hemoglobin: 10.4 g/dL — ABNORMAL LOW (ref 13.0–17.0)
Lymphs Abs: 0.7 10*3/uL (ref 0.7–4.0)
MCH: 30.1 pg (ref 26.0–34.0)
MCV: 89.6 fL (ref 78.0–100.0)
Monocytes Absolute: 0.3 10*3/uL (ref 0.1–1.0)
Monocytes Relative: 7 % (ref 3–12)
Neutrophils Relative %: 71 % (ref 43–77)
Platelets: 131 10*3/uL — ABNORMAL LOW (ref 150–400)
RBC: 3.46 MIL/uL — ABNORMAL LOW (ref 4.22–5.81)

## 2013-03-31 LAB — COMPREHENSIVE METABOLIC PANEL
ALT: 22 U/L (ref 0–53)
BUN: 32 mg/dL — ABNORMAL HIGH (ref 6–23)
CO2: 27 mEq/L (ref 19–32)
Calcium: 9.4 mg/dL (ref 8.4–10.5)
Creatinine, Ser: 9.84 mg/dL — ABNORMAL HIGH (ref 0.50–1.35)
GFR calc Af Amer: 7 mL/min — ABNORMAL LOW (ref >90)
GFR calc non Af Amer: 6 mL/min — ABNORMAL LOW (ref >90)
Glucose, Bld: 120 mg/dL — ABNORMAL HIGH (ref 70–99)
Potassium: 3.9 mEq/L (ref 3.5–5.1)
Sodium: 141 mEq/L (ref 135–145)
Total Protein: 7.1 g/dL (ref 6.0–8.3)

## 2013-03-31 LAB — TROPONIN I: Troponin I: <0.3 ng/mL (ref <0.30)

## 2013-03-31 LAB — BASIC METABOLIC PANEL
BUN: 28 mg/dL — ABNORMAL HIGH (ref 6–23)
CO2: 29 mEq/L (ref 19–32)
Chloride: 100 mEq/L (ref 96–112)
Creatinine, Ser: 8.15 mg/dL — ABNORMAL HIGH (ref 0.50–1.35)

## 2013-03-31 MED ORDER — DILTIAZEM HCL 100 MG IV SOLR
5.0000 mg/h | INTRAVENOUS | Status: DC
  Administered 2013-03-31 – 2013-04-01 (×2): 5 mg/h via INTRAVENOUS
  Administered 2013-04-01: 10 mg/h via INTRAVENOUS
  Filled 2013-03-31: qty 100

## 2013-03-31 MED ORDER — DILTIAZEM HCL ER COATED BEADS 300 MG PO CP24: 300.0000 mg | ORAL_CAPSULE | Freq: Every day | ORAL | Status: DC

## 2013-03-31 MED ORDER — ATORVASTATIN CALCIUM 10 MG PO TABS: 10.0000 mg | ORAL_TABLET | Freq: Every day | ORAL | Status: DC

## 2013-03-31 MED ORDER — DILTIAZEM LOAD VIA INFUSION
15.0000 mg | Freq: Once | INTRAVENOUS | Status: AC
  Administered 2013-03-31: 15 mg via INTRAVENOUS
  Filled 2013-03-31: qty 15

## 2013-03-31 MED ORDER — CEFUROXIME AXETIL 500 MG PO TABS: 500.0000 mg | ORAL_TABLET | Freq: Two times a day (BID) | ORAL | Status: DC

## 2013-03-31 MED ORDER — DILTIAZEM HCL ER COATED BEADS 180 MG PO CP24: 300.0000 mg | ORAL_CAPSULE | Freq: Every day | ORAL | Status: DC

## 2013-03-31 MED ORDER — BENZONATATE 100 MG PO CAPS: 100.0000 mg | ORAL_CAPSULE | Freq: Three times a day (TID) | ORAL | Status: DC | PRN

## 2013-03-31 NOTE — Progress Notes (Signed)
Justin Ortega  MRN: [id number]  DOB/AGE: Mar 12, 1981 32 y.o.  Primary Care Physician:NYLAND,LEONARD [name], MD  Admit date: 03/27/2013  Chief Complaint:  Chief Complaint  Patient presents with  . Shortness of Breath  . Cough    S-Pt presented on  03/27/2013 with  Chief Complaint  Patient presents with  . Shortness of Breath  . Cough  .   Pt offers no new complaints.   Meds . aspirin  325 mg Oral Daily  . atorvastatin  10 mg Oral q1800  . diltiazem  60 mg Oral BID  . epoetin (EPOGEN/PROCRIT) injection  10,000 Units Intravenous Q M,W,F-HD  . heparin  5,000 Units Subcutaneous Q8H  . hydrALAZINE  50 mg Oral BID  . lanthanum  1,000 mg Oral TID WC  . lisinopril  40 mg Oral Daily  . omega-3 acid ethyl esters  2,000 mg Oral BID  . piperacillin-tazobactam (ZOSYN)  IV  2.25 g Intravenous Q8H  . sodium bicarbonate  650 mg Oral BID  . sodium chloride  3 mL Intravenous Q12H  . sodium chloride  3 mL Intravenous Q12H         Physical Exam: Vital signs in last 24 hours: Temp:  [96.8 F (36 C)-98.7 F (37.1 C)] 97.6 F (36.4 C) (11/25 0531) Pulse Rate:  [80-112] 90 (11/25 0531) Resp:  [19-20] 20 (11/25 0531) BP: (140-179)/(71-111) 179/93 mmHg (11/24 2100) SpO2:  [89 %-98 %] 89 % (11/25 0531) Weight:  [220 lb 7.4 oz (100 kg)-233 lb 14.5 oz (106.1 kg)] 220 lb 7.4 oz (100 kg) (11/25 0500) Weight change: 15 lb 3.4 oz (6.9 kg) Last BM Date: 03/31/13  Intake/Output from previous day: 11/24 0701 - 11/25 0700 In: 840.2 [P.O.:240; I.V.:250.2; IV Piggyback:350] Out: 2017  Total I/O In: 120 [I.V.:20; IV Piggyback:100] Out: -    Physical Exam: General- pt is awake,alert, oriented to time place and person Resp- No acute REsp distress, CTA B/L NO Rhonchi CVS- S1S2 regular in rate and rhythm GIT- BS+, soft, NT, ND EXT- NO LE Edema, Cyanosis Access -left AVf + Thrill   Lab Results: CBC  Recent Labs  03/30/13 0550 03/31/13 0459  WBC 2.5* 4.5  HGB 8.8* 9.5*  HCT 26.4*  28.4*  PLT 87* 112*    BMET  Recent Labs  03/30/13 0550 03/31/13 0459  NA 137 141  K 4.6 3.9  CL 96 100  CO2 28 29  GLUCOSE 134* 109*  BUN 59* 28*  CREATININE 13.18* 8.15*  CALCIUM 8.7 9.0    MICRO Recent Results (from the past 240 hour(s))  CULTURE, BLOOD (ROUTINE X 2)     Status: None   Collection Time    03/27/13 12:31 PM      Result Value Range Status   Specimen Description BLOOD LEFT HAND   Final   Special Requests BOTTLES DRAWN AEROBIC AND ANAEROBIC  8 CC   Final   Culture NO GROWTH 3 DAYS   Final   Report Status PENDING   Incomplete  CULTURE, BLOOD (ROUTINE X 2)     Status: None   Collection Time    03/27/13 12:31 PM      Result Value Range Status   Specimen Description LEFT ANTECUBITAL   Final   Special Requests BOTTLES DRAWN AEROBIC AND ANAEROBIC 15 CC   Final   Culture NO GROWTH 3 DAYS   Final   Report Status PENDING   Incomplete  MRSA PCR SCREENING     Status: None  Collection Time    03/29/13  6:21 PM      Result Value Range Status   MRSA by PCR NEGATIVE  NEGATIVE Final   Comment:            The GeneXpert MRSA Assay (FDA     approved for NASAL specimens     only), is one component of a     comprehensive MRSA colonization     surveillance program. It is not     intended to diagnose MRSA     infection nor to guide or     monitor treatment for     MRSA infections.      Lab Results  Component Value Date   CALCIUM 9.0 03/31/2013   PHOS 4.3 03/28/2013        Impression: 1)Renal  ESRD on HD On Monday/wed/Friday schedule Pt dialyzed yesterday  No need of HD today   2)HTN BP not at goal Target Organ damage  CKD  Medication- On RAS blockers-  On Calcium channel blockers On Vasodilators-   3)Anemia HGb at goal (9--11) On Epo  4)CKD Mineral-Bone Disorder Phosphorus at goal.  on Binders  5)ID-admitted with HCAP On ABX Primary MD following  6)Electrolytes Normokalemic Hyponatremic Sec to ESRD   7)Acid base Co2 at  goal  8) CVS-SVT/ Afib On Cardizem    Plan:   Will suggest to  increase hydralazine to TID Will suggest to add Labetalol if hydralazine does not help     Judythe Postema S 03/31/2013, 6:54 AM

## 2013-03-31 NOTE — Discharge Summary (Signed)
Physician Discharge Summary  Justin Ortega:[id number] DOB: Sep 13, 1980 DOA: 03/27/2013  PCP: Josue Hector, MD  Admit date: 03/27/2013 Discharge date: 03/31/2013  Time spent: 40 minutes  Recommendations for Outpatient Follow-up:  1. Followup on patient's blood pressures, as his antihypertensive agents were modified during this hospitalization  Discharge Diagnoses:  Active Problems:   HCAP (healthcare-associated pneumonia)   ESRD needing dialysis   Anemia   HTN (hypertension)   Diabetes type 2, controlled   Discharge Condition: Stable/improved  Diet recommendation: Renal diet  Filed Weights   03/30/13 0500 03/30/13 1105 03/31/13 0500  Weight: 101.8 kg (224 lb 6.9 oz) 106.1 kg (233 lb 14.5 oz) 100 kg (220 lb 7.4 oz)    History of present illness:  Justin Ortega is a 32 y.o. male with a past medical history of end-stage renal disease, on hemodialysis Tuesdays Thursdays and Saturdays, who presents emergency department with complaints of increasing cough and shortness of breath. He states symptoms started approximately 2 weeks ago, with coughing congestion which did not improve over time. In the last 3-4 days, he had increasing cough, with clear sputum production, generalized weakness, malaise, shortness of breath and feeling sick overall. He states yesterday missing dialysis today and unable to leave his home due to his illness. He presented to the emergency department today where a chest x-ray revealed extensive airspace infiltrate in both left upper and left lower lobes consistent with pneumonia. He denies sick contacts or recent travel. He has not been on recent antibiotic therapy. in the emergency room he was mentating well with stable vital signs. I discussed case with his nephrologist made him aware of patient's admission and need for hemodialysis.   Hospital Course:  Patient is a pleasant 32 year old gentleman with a history of end-stage renal disease, on hemodialysis  Tuesdays Thursdays and Saturdays, presenting with increasing cough and shortness of breath, x-ray on admission showed extensive airspace infiltrate in both left upper and left lower lobes.  He was also found to be in acute respiratory failure evidence by an O2 sat of 89% on room air and required supplemental oxygen, up to 3 L during this hospitalization. This was likely secondary to healthcare associated pneumonia. He was started on empiric broad-spectrum IV antibiotic therapy with vancomycin and Zosyn. Patient underwent hemodialysis during this hospitalization as nephrology was involved. On 03/29/2013 patient was found to be tachycardic for which I initially suspected was secondary to dehydration/volume depletion. He reported having minimal by mouth intake in the days prior to this hospitalization. He also appears clinically dry. Patient was administered IV fluids and despite this intervention and remained tachycardic. An EKG was obtained on 03/29/2013 which showed SVT versus atrial flutter. I discussed case with cardiology requesting a second opinion and review of his EKGs. One of his EKGs red acute MI. He did not report chest pain, worsening shortness of breath, diaphoresis, nausea or vomiting. Cardiac enzymes were checked which came back negative. He was transferred to the intensive care unit initially administered 6 mg of adenosine followed by 12 mg of adenosine which had no effect. This was followed by a Cardizem drip initially administering 10 mg IV bolus. On the evening of 03/29/2013 his heart rates improving to the 70s and 80s. Cardiology was consulted as this represented new onset atrial fibrillation. Patient having a ChadsVasc score of 1, was not started on anticoagulation. I transitioned him to oral Cardizem, and restarted his lisinopril, hydralazine. By 03/31/2013 he had improved significantly, ambulated down the hallway without supplemental  oxygen satting 91%. He was discharged on oral Ceftin. Patient  to receive hospital followup with his nephrologist at hemodialysis. Patient was discharged in stable condition on 03/31/2013.    Consultations:  Nephrology  Cardiology  Discharge Exam: Filed Vitals:   03/31/13 0531  BP:   Pulse: 90  Temp: 97.6 F (36.4 C)  Resp: 20    General: Patient reports feeling better, no acute distress, ambulating down the hallway, doing well.  Cardiovascular: Regular rate rhythm normal S1-S2 Respiratory: Improved lung exam, normal inspiratory effort.  Abdomen: Soft, NT, ND, positive BS  Discharge Instructions  Discharge Orders   Future Orders Complete By Expires   Call MD for:  difficulty breathing, headache or visual disturbances  As directed    Call MD for:  extreme fatigue  As directed    Call MD for:  hives  As directed    Call MD for:  persistant dizziness or light-headedness  As directed    Call MD for:  persistant nausea and vomiting  As directed    Call MD for:  temperature >100.4  As directed    Diet - low sodium heart healthy  As directed    Increase activity slowly  As directed        Medication List    STOP taking these medications       amLODipine 5 MG tablet  Commonly known as:  NORVASC     labetalol 300 MG tablet  Commonly known as:  NORMODYNE      TAKE these medications       acetaminophen 500 MG tablet  Commonly known as:  TYLENOL  Take 500 mg by mouth every 6 (six) hours as needed for pain.     atorvastatin 10 MG tablet  Commonly known as:  LIPITOR  Take 1 tablet (10 mg total) by mouth daily at 6 PM.     benzonatate 100 MG capsule  Commonly known as:  TESSALON  Take 1 capsule (100 mg total) by mouth 3 (three) times daily as needed for cough.     cefUROXime 500 MG tablet  Commonly known as:  CEFTIN  Take 1 tablet (500 mg total) by mouth 2 (two) times daily with a meal.     diltiazem 300 MG 24 hr capsule  Commonly known as:  CARDIZEM CD  Take 1 capsule (300 mg total) by mouth daily.     diphenhydrAMINE 25  mg capsule  Commonly known as:  BENADRYL  Take 25 mg by mouth every 6 (six) hours as needed for allergies.     Fish Oil 1000 MG Caps  Take 2,000 mg by mouth 2 (two) times daily.     hydrALAZINE 50 MG tablet  Commonly known as:  APRESOLINE  Take 50 mg by mouth 2 (two) times daily.     lanthanum 500 MG chewable tablet  Commonly known as:  FOSRENOL  Chew 1,000 mg by mouth. Take 2 tabs w/ every meal & 1 w/ every snack.     lisinopril 40 MG tablet  Commonly known as:  PRINIVIL,ZESTRIL  Take 40 mg by mouth daily.     pravastatin 40 MG tablet  Commonly known as:  PRAVACHOL  Take 40 mg by mouth daily.     sodium bicarbonate 650 MG tablet  Take 650 mg by mouth 2 (two) times daily.       Allergies  Allergen Reactions  . Bystolic [Nebivolol Hcl] Palpitations       Follow-up Information  Follow up with Josue Hector, MD On 04/16/2013. (at 3:15 pm)    Specialty:  Family Medicine   Contact information:   [address] [place] [address] [phone]       Follow up with [facility] S, MD In 2 days. (need to call the office for appointment)    Specialty:  Nephrology   Contact information:   [address] [place] [address] [phone]        The results of significant diagnostics from this hospitalization (including imaging, microbiology, ancillary and laboratory) are listed below for reference.    Significant Diagnostic Studies: Dg Chest 2 View  03/29/2013   CLINICAL DATA:  Pneumonia.  EXAM: CHEST  2 VIEW  COMPARISON:  March 27, 2013.  FINDINGS: Stable cardiomegaly. Right lung is clear. Left lower lobe airspace opacity is slightly improved consistent with pneumonia. No pneumothorax or pleural effusion is noted. Bony thorax is intact.  IMPRESSION: Slightly improved left lower lobe pneumonia.   Electronically Signed   By: Roque Lias M.D.   On: 03/29/2013 10:45   Dg Chest 2 View  03/27/2013   CLINICAL DATA:  Cough, shortness of breath   EXAM: CHEST  2 VIEW  COMPARISON:  03/04/2007  FINDINGS: Enlargement of cardiac silhouette.  Mediastinal contours normal.  Mild pulmonary vascular congestion.  Extensive airspace infiltrate identified in both the left upper and left lower lobes most consistent with pneumonia.  Central peribronchial thickening noted.  Right lung grossly clear.  Question component of pleural effusion at lateral left lung base.  No pneumothorax or acute osseous findings.  IMPRESSION: Enlargement of cardiac silhouette.  Extensive infiltrate in left upper and left lower lobes most consistent with pneumonia.  Question small left pleural effusion.   Electronically Signed   By: Ulyses Southward M.D.   On: 03/27/2013 10:52    Microbiology: Recent Results (from the past 240 hour(s))  CULTURE, BLOOD (ROUTINE X 2)     Status: None   Collection Time    03/27/13 12:31 PM      Result Value Range Status   Specimen Description BLOOD LEFT HAND   Final   Special Requests BOTTLES DRAWN AEROBIC AND ANAEROBIC 8CC   Final   Culture NO GROWTH 4 DAYS   Final   Report Status PENDING   Incomplete  CULTURE, BLOOD (ROUTINE X 2)     Status: None   Collection Time    03/27/13 12:31 PM      Result Value Range Status   Specimen Description BLOOD LEFT ANTECUBITAL   Final   Special Requests BOTTLES DRAWN AEROBIC AND ANAEROBIC 15CC   Final   Culture NO GROWTH 4 DAYS   Final   Report Status PENDING   Incomplete  MRSA PCR SCREENING     Status: None   Collection Time    03/29/13  6:21 PM      Result Value Range Status   MRSA by PCR NEGATIVE  NEGATIVE Final   Comment:            The GeneXpert MRSA Assay (FDA     approved for NASAL specimens     only), is one component of a     comprehensive MRSA colonization     surveillance program. It is not     intended to diagnose MRSA     infection nor to guide or     monitor treatment for     MRSA infections.     Labs: Basic Metabolic Panel:  Recent Labs Lab 03/27/13 1057 03/28/13 0632  03/29/13 0534 03/30/13 0550 03/31/13 0459  NA 137 137 133* 137 141  K 4.9 4.8 4.1 4.6 3.9  CL 94* 92* 91* 96 100  CO2 26 29 28 28 29   GLUCOSE 143* 125* 168* 134* 109*  BUN 66* 34* 51* 59* 28*  CREATININE 14.38* 8.79* 11.47* 13.18* 8.15*  CALCIUM 9.4 9.6 9.1 8.7 9.0  PHOS  --  4.3  --   --   --    Liver Function Tests:  Recent Labs Lab 03/27/13 1057 03/28/13 0632  AST 18 18  ALT 17 18  ALKPHOS 82 76  BILITOT 0.4 0.4  PROT 6.9 6.9  ALBUMIN 3.3* 3.2*   No results found for this basename: LIPASE, AMYLASE,  in the last 168 hours No results found for this basename: AMMONIA,  in the last 168 hours CBC:  Recent Labs Lab 03/27/13 1057 03/28/13 0632 03/29/13 0534 03/30/13 0550 03/31/13 0459  WBC 4.9 4.4 2.8* 2.5* 4.5  NEUTROABS 4.2  --   --   --   --   HGB 9.4* 10.4* 9.2* 8.8* 9.5*  HCT 27.9* 31.5* 27.8* 26.4* 28.4*  MCV 90.9 92.1 91.1 91.3 90.7  PLT 117* 123* 114* 87* 112*   Cardiac Enzymes:  Recent Labs Lab 03/29/13 1744 03/29/13 2345 03/30/13 0553  TROPONINI <0.30 <0.30 <0.30   BNP: BNP (last 3 results) No results found for this basename: PROBNP,  in the last 8760 hours CBG: No results found for this basename: GLUCAP,  in the last 168 hours     Signed:  [name]  [facility] 03/31/2013, 1:38 PM

## 2013-03-31 NOTE — ED Notes (Signed)
Patient placed on continuous cardiac monitoring, continuous pulse 0x monitoring, and placed patient of 2L O2 [address] [place].  

## 2013-03-31 NOTE — Progress Notes (Signed)
Patient received discharge instructions along with follow up appointments and prescriptions. Patient verbalized understanding of all instructions. Patient was escorted by staff via wheelchair to vehicle. Patient discharged to home in stable condition. 

## 2013-03-31 NOTE — ED Notes (Signed)
Dr. Cook at the bedside.  

## 2013-03-31 NOTE — ED Provider Notes (Signed)
CSN: [id number]     Arrival date & time 03/31/13  2125 History  This chart was scribed for Donnetta Hutching, MD by Ronal Fear, ED Scribe. This patient was seen in room APA09/APA09 and the patient's care was started at 9:54 PM.   Chief Complaint  Patient presents with  . Tachycardia  . Hypertension   (Consider location/radiation/quality/duration/timing/severity/associated sxs/prior Treatment) The history is provided by the patient. No language interpreter was used.    HPI Comments: Justin Ortega is a 32 y.o. male who presents to the Emergency Department complaining of rapid heart rate.. Pt was admitted 5 days ago for PNA.   Two nights ago pt developed a-fib which was rxed c Cardizem drip followed by PO Cardizem.. Pt is also on dialysis.  Patient converted to normal sinus rhythm and was discharged home today at noon.  He then experienced a rapid heart rate and hypertension later this afternoon. No chest pain, dyspnea, fever, chills    Past Medical History  Diagnosis Date  . Renal disorder   . Blind in both eyes   . Hypertension    Past Surgical History  Procedure Laterality Date  . Eye surgery    . Dialysis fistula creation     Family History  Problem Relation Age of Onset  . Diabetes Mother   . Hypertension Mother   . Diabetes Father   . Hypertension Father   . Heart disease Mother     Died ([facility]) on dialysis  . Heart disease Father   . Kidney failure Father   . Kidney failure Mother    History  Substance Use Topics  . Smoking status: Current Every Day Smoker -- 0.25 packs/day    Types: Cigarettes  . Smokeless tobacco: Not on file  . Alcohol Use: No    Review of Systems  Cardiovascular: Positive for palpitations.  All other systems reviewed and are negative.    Allergies  Bystolic  Home Medications   Current Outpatient Rx  Name  Route  Sig  Dispense  Refill  . acetaminophen (TYLENOL) 500 MG tablet   Oral   Take 500 mg by mouth every 6 (six) hours as needed for  pain.         Marland Kitchen amLODipine (NORVASC) 5 MG tablet   Oral   Take 5 mg by mouth 2 (two) times daily.         Marland Kitchen atorvastatin (LIPITOR) 10 MG tablet   Oral   Take 1 tablet (10 mg total) by mouth daily at 6 PM.   300 tablet   0   . benzonatate (TESSALON) 100 MG capsule   Oral   Take 1 capsule (100 mg total) by mouth 3 (three) times daily as needed for cough.   20 capsule   0   . cefUROXime (CEFTIN) 500 MG tablet   Oral   Take 1 tablet (500 mg total) by mouth 2 (two) times daily with a meal.   10 tablet   0   . diltiazem (CARDIZEM CD) 300 MG 24 hr capsule   Oral   Take 1 capsule (300 mg total) by mouth daily.   30 capsule   0   . diphenhydrAMINE (BENADRYL) 25 mg capsule   Oral   Take 25 mg by mouth every 6 (six) hours as needed for allergies.         . hydrALAZINE (APRESOLINE) 50 MG tablet   Oral   Take 50 mg by mouth 2 (two) times daily.         Marland Kitchen  labetalol (NORMODYNE) 300 MG tablet   Oral   Take 300 mg by mouth 2 (two) times daily.         Marland Kitchen lanthanum (FOSRENOL) 500 MG chewable tablet   Oral   Chew 1,000 mg by mouth. Take 2 tabs w/ every meal & 1 w/ every snack.         Marland Kitchen lisinopril (PRINIVIL,ZESTRIL) 40 MG tablet   Oral   Take 40 mg by mouth daily.         . metoprolol succinate (TOPROL-XL) 100 MG 24 hr tablet   Oral   Take 100 mg by mouth 2 (two) times daily.         . Omega-3 Fatty Acids (FISH OIL) 1000 MG CAPS   Oral   Take 2,000 mg by mouth 2 (two) times daily.         . pravastatin (PRAVACHOL) 40 MG tablet   Oral   Take 40 mg by mouth daily.         . sodium bicarbonate 650 MG tablet   Oral   Take 650 mg by mouth 2 (two) times daily.          BP 174/104  Pulse 151  Temp(Src) 97.9 F (36.6 C) (Oral)  Resp 18  Ht 5\' 8"  (1.727 m)  Wt 220 lb (99.791 kg)  BMI 33.46 kg/m2  SpO2 95% Physical Exam  Nursing note and vitals reviewed. Constitutional: He is oriented to person, place, and time. He appears well-developed and  well-nourished.  HENT:  Head: Normocephalic and atraumatic.  Eyes: Conjunctivae are normal.  Blind bilaterally  Neck: Normal range of motion. Neck supple.  Cardiovascular: Regular rhythm and normal heart sounds.  Tachycardia present.   Pulmonary/Chest: Effort normal and breath sounds normal.  Abdominal: Soft. Bowel sounds are normal.  Genitourinary:  Normal genitalia  Musculoskeletal: Normal range of motion.  Neurological: He is alert and oriented to person, place, and time.  Skin: Skin is warm and dry.  Pink color  Psychiatric: He has a normal mood and affect. His behavior is normal.    ED Course  Procedures (including critical care time) DIAGNOSTIC STUDIES: Oxygen Saturation is 95% on RA, normal by my interpretation.    COORDINATION OF CARE:   9:59 PM- Pt advised of plan for treatment including Cardizem and lopressor. Pt will be admitted to the hospital and pt agrees.  Labs Review Labs Reviewed  CBC WITH DIFFERENTIAL - Abnormal; Notable for the following:    RBC 3.46 (*)    Hemoglobin 10.4 (*)    HCT 31.0 (*)    Platelets 131 (*)    All other components within normal limits  COMPREHENSIVE METABOLIC PANEL - Abnormal; Notable for the following:    Glucose, Bld 120 (*)    BUN 32 (*)    Creatinine, Ser 9.84 (*)    Albumin 3.3 (*)    GFR calc non Af Amer 6 (*)    GFR calc Af Amer 7 (*)    All other components within normal limits  TROPONIN I   Imaging Review Dg Chest Portable 1 View  03/31/2013   CLINICAL DATA:  Tachycardia and increased blood pressure tonight. Hospitalized since Friday for pneumonia and discharge today.  EXAM: PORTABLE CHEST - 1 VIEW  COMPARISON:  03/29/2013  FINDINGS: Cardiac enlargement with prominent pulmonary vascularity suggesting mild congestion. Infiltration in the left lung base appears stable since previous study and is consistent with pneumonia. No blunting of costophrenic angles. No pneumothorax.  IMPRESSION: Cardiac enlargement with mild  pulmonary vascular congestion. No edema. Left lower lung infiltration consistent with pneumonia appear stable.   Electronically Signed   By: Burman Nieves M.D.   On: 03/31/2013 22:44    EKG Interpretation    Date/Time:  Tuesday March 31 2013 21:43:27 EST Ventricular Rate:  147 PR Interval:    QRS Duration: 94 QT Interval:  282 QTC Calculation: 441 R Axis:   99 Text Interpretation:  Atrial flutter with 2:1 A-V conduction Rightward axis Nonspecific ST and T wave abnormality Abnormal ECG When compared with ECG of 30-Mar-2013 09:08, Atrial flutter has replaced Atrial fibrillation Vent. rate has increased BY  74 BPM ST now depressed in Inferior leads Nonspecific T wave abnormality now evident in Inferior leads Nonspecific T wave abnormality has replaced inverted T waves in Lateral leads Confirmed by Ilene Witcher  MD, Sione Baumgarten ([id number]) on 03/31/2013 11:49:23 PM           CRITICAL CARE Performed by: Donnetta Hutching Total critical care time: 30 Critical care time was exclusive of separately billable procedures and treating other patients. Critical care was necessary to treat or prevent imminent or life-threatening deterioration. Critical care was time spent personally by me on the following activities: development of treatment plan with patient and/or surrogate as well as nursing, discussions with consultants, evaluation of patient's response to treatment, examination of patient, obtaining history from patient or surrogate, ordering and performing treatments and interventions, ordering and review of laboratory studies, ordering and review of radiographic studies, pulse oximetry and re-evaluation of patient's condition. MDM  No diagnosis found.  Patient is complex.   He has ESRD, improving PNA, and recurrent a fib.   RX iv cardizem.  Admit to telemetry    I personally performed the services described in this documentation, which was scribed in my presence. The recorded information has been reviewed and  is accurate.    Donnetta Hutching, MD 03/31/13 2351

## 2013-03-31 NOTE — ED Notes (Signed)
Patient states he was discharged from here today after being treated for pneumonia and new onset a-fib. States "I am feeling worse and when I woke up from a nap my heartbeat 184 and my blood pressure 215/110."

## 2013-04-01 DIAGNOSIS — I10 Essential (primary) hypertension: Secondary | ICD-10-CM

## 2013-04-01 DIAGNOSIS — J189 Pneumonia, unspecified organism: Secondary | ICD-10-CM

## 2013-04-01 DIAGNOSIS — J811 Chronic pulmonary edema: Secondary | ICD-10-CM

## 2013-04-01 DIAGNOSIS — N186 End stage renal disease: Secondary | ICD-10-CM

## 2013-04-01 DIAGNOSIS — I4891 Unspecified atrial fibrillation: Principal | ICD-10-CM

## 2013-04-01 LAB — COMPREHENSIVE METABOLIC PANEL
AST: 26 U/L (ref 0–37)
Albumin: 3 g/dL — ABNORMAL LOW (ref 3.5–5.2)
BUN: 35 mg/dL — ABNORMAL HIGH (ref 6–23)
CO2: 25 mEq/L (ref 19–32)
Calcium: 9.4 mg/dL (ref 8.4–10.5)
Creatinine, Ser: 10.46 mg/dL — ABNORMAL HIGH (ref 0.50–1.35)
GFR calc non Af Amer: 6 mL/min — ABNORMAL LOW (ref >90)
Glucose, Bld: 116 mg/dL — ABNORMAL HIGH (ref 70–99)
Potassium: 4.2 mEq/L (ref 3.5–5.1)

## 2013-04-01 LAB — CULTURE, BLOOD (ROUTINE X 2): Culture: NO GROWTH

## 2013-04-01 LAB — CBC
HCT: 30 % — ABNORMAL LOW (ref 39.0–52.0)
Hemoglobin: 10 g/dL — ABNORMAL LOW (ref 13.0–17.0)
MCH: 30 pg (ref 26.0–34.0)
MCHC: 33.3 g/dL (ref 30.0–36.0)
MCV: 90.1 fL (ref 78.0–100.0)
Platelets: 134 10*3/uL — ABNORMAL LOW (ref 150–400)
RDW: 12.8 % (ref 11.5–15.5)

## 2013-04-01 MED ORDER — EPOETIN ALFA 4000 UNIT/ML IJ SOLN
4000.0000 [IU] | INTRAMUSCULAR | Status: DC
  Administered 2013-04-01: 4000 [IU] via INTRAVENOUS
  Filled 2013-04-01 (×2): qty 1

## 2013-04-01 MED ORDER — SIMVASTATIN 20 MG PO TABS: 20.0000 mg | ORAL_TABLET | Freq: Every day | ORAL | Status: DC

## 2013-04-01 MED ORDER — ASPIRIN 325 MG PO TABS
325.0000 mg | ORAL_TABLET | Freq: Every day | ORAL | Status: DC
  Administered 2013-04-01 – 2013-04-03 (×3): 325 mg via ORAL
  Filled 2013-04-01 (×3): qty 1

## 2013-04-01 MED ORDER — DIPHENHYDRAMINE HCL 25 MG PO CAPS: 25.0000 mg | ORAL_CAPSULE | Freq: Four times a day (QID) | ORAL | Status: DC | PRN

## 2013-04-01 MED ORDER — SODIUM BICARBONATE 650 MG PO TABS
650.0000 mg | ORAL_TABLET | Freq: Two times a day (BID) | ORAL | Status: DC
  Administered 2013-04-01 – 2013-04-03 (×5): 650 mg via ORAL
  Filled 2013-04-01 (×5): qty 1

## 2013-04-01 MED ORDER — AMIODARONE HCL IN DEXTROSE 360-4.14 MG/200ML-% IV SOLN
INTRAVENOUS | Status: AC
  Administered 2013-04-01: 200 mL
  Filled 2013-04-01: qty 200

## 2013-04-01 MED ORDER — SODIUM CHLORIDE 0.9 % IJ SOLN
3.0000 mL | Freq: Two times a day (BID) | INTRAMUSCULAR | Status: DC
  Administered 2013-04-02 – 2013-04-03 (×2): 3 mL via INTRAVENOUS

## 2013-04-01 MED ORDER — DILTIAZEM HCL 60 MG PO TABS
60.0000 mg | ORAL_TABLET | Freq: Two times a day (BID) | ORAL | Status: DC
  Administered 2013-04-02: 60 mg via ORAL
  Filled 2013-04-01: qty 1

## 2013-04-01 MED ORDER — ATORVASTATIN CALCIUM 10 MG PO TABS
10.0000 mg | ORAL_TABLET | Freq: Every day | ORAL | Status: DC
  Administered 2013-04-01 – 2013-04-02 (×2): 10 mg via ORAL
  Filled 2013-04-01 (×2): qty 1

## 2013-04-01 MED ORDER — LIDOCAINE-PRILOCAINE 2.5-2.5 % EX CREA
1.0000 "application " | TOPICAL_CREAM | CUTANEOUS | Status: DC | PRN
  Filled 2013-04-01: qty 5

## 2013-04-01 MED ORDER — AMIODARONE IV BOLUS ONLY 150 MG/100ML: 150.0000 mg | Freq: Once | INTRAVENOUS | Status: DC

## 2013-04-01 MED ORDER — AMIODARONE HCL 150 MG/3ML IV SOLN
150.0000 mg | Freq: Once | INTRAVENOUS | Status: AC
  Administered 2013-04-01: 150 mg via INTRAVENOUS
  Filled 2013-04-01: qty 3

## 2013-04-01 MED ORDER — PENTAFLUOROPROP-TETRAFLUOROETH EX AERO
1.0000 "application " | INHALATION_SPRAY | CUTANEOUS | Status: DC | PRN
  Filled 2013-04-01: qty 103.5

## 2013-04-01 MED ORDER — LABETALOL HCL 200 MG PO TABS
300.0000 mg | ORAL_TABLET | Freq: Two times a day (BID) | ORAL | Status: DC
  Administered 2013-04-02: 300 mg via ORAL
  Filled 2013-04-01 (×2): qty 2

## 2013-04-01 MED ORDER — LISINOPRIL 10 MG PO TABS
40.0000 mg | ORAL_TABLET | Freq: Every day | ORAL | Status: DC
Start: 2013-04-01 — End: 2013-04-03
  Administered 2013-04-01 – 2013-04-03 (×3): 40 mg via ORAL
  Filled 2013-04-01 (×3): qty 4

## 2013-04-01 MED ORDER — AMIODARONE HCL IN DEXTROSE 360-4.14 MG/200ML-% IV SOLN
30.0000 mg/h | INTRAVENOUS | Status: DC
  Administered 2013-04-01 – 2013-04-02 (×2): 30 mg/h via INTRAVENOUS
  Filled 2013-04-01: qty 200

## 2013-04-01 MED ORDER — SODIUM CHLORIDE 0.9 % IV SOLN: 100.0000 mL | INTRAVENOUS | Status: DC | PRN

## 2013-04-01 MED ORDER — METOPROLOL TARTRATE 50 MG PO TABS
50.0000 mg | ORAL_TABLET | Freq: Two times a day (BID) | ORAL | Status: DC
  Administered 2013-04-01 (×2): 50 mg via ORAL
  Filled 2013-04-01 (×2): qty 1

## 2013-04-01 MED ORDER — LIDOCAINE HCL (PF) 1 % IJ SOLN: 5.0000 mL | INTRAMUSCULAR | Status: DC | PRN

## 2013-04-01 MED ORDER — AMIODARONE HCL IN DEXTROSE 360-4.14 MG/200ML-% IV SOLN
60.0000 mg/h | INTRAVENOUS | Status: AC
  Filled 2013-04-01: qty 200

## 2013-04-01 MED ORDER — HEPARIN SODIUM (PORCINE) 5000 UNIT/ML IJ SOLN
5000.0000 [IU] | Freq: Three times a day (TID) | INTRAMUSCULAR | Status: DC
  Administered 2013-04-01 – 2013-04-03 (×8): 5000 [IU] via SUBCUTANEOUS
  Filled 2013-04-01 (×9): qty 1

## 2013-04-01 MED ORDER — BENZONATATE 100 MG PO CAPS
100.0000 mg | ORAL_CAPSULE | Freq: Three times a day (TID) | ORAL | Status: DC | PRN
  Administered 2013-04-01 – 2013-04-02 (×4): 100 mg via ORAL
  Filled 2013-04-01 (×4): qty 1

## 2013-04-01 MED ORDER — HEPARIN SODIUM (PORCINE) 1000 UNIT/ML DIALYSIS
100.0000 [IU]/kg | INTRAMUSCULAR | Status: DC | PRN
  Filled 2013-04-01: qty 10

## 2013-04-01 MED ORDER — SODIUM CHLORIDE 0.9 % IJ SOLN: 3.0000 mL | INTRAMUSCULAR | Status: DC | PRN

## 2013-04-01 MED ORDER — ALTEPLASE 2 MG IJ SOLR: 2.0000 mg | Freq: Once | INTRAMUSCULAR | Status: AC | PRN

## 2013-04-01 MED ORDER — CEFUROXIME AXETIL 250 MG PO TABS
500.0000 mg | ORAL_TABLET | Freq: Two times a day (BID) | ORAL | Status: DC
Start: 2013-04-01 — End: 2013-04-03
  Administered 2013-04-01 – 2013-04-03 (×5): 500 mg via ORAL
  Filled 2013-04-01 (×5): qty 2

## 2013-04-01 MED ORDER — HEPARIN SODIUM (PORCINE) 1000 UNIT/ML DIALYSIS
1000.0000 [IU] | INTRAMUSCULAR | Status: DC | PRN
  Filled 2013-04-01: qty 1

## 2013-04-01 MED ORDER — SODIUM CHLORIDE 0.9 % IV SOLN: 250.0000 mL | INTRAVENOUS | Status: DC | PRN

## 2013-04-01 MED ORDER — NEPRO/CARBSTEADY PO LIQD: 237.0000 mL | ORAL | Status: DC | PRN

## 2013-04-01 MED ORDER — HYDRALAZINE HCL 25 MG PO TABS
50.0000 mg | ORAL_TABLET | Freq: Two times a day (BID) | ORAL | Status: DC
  Administered 2013-04-01 – 2013-04-03 (×5): 50 mg via ORAL
  Filled 2013-04-01 (×5): qty 2

## 2013-04-01 NOTE — Consult Note (Signed)
Justin Ortega MRN: [id number] DOB/AGE: Jan 10, 1981 32 y.o. Primary Care Physician:Ortega,Justin Justin Maduro, MD Admit date: 03/31/2013 Chief Complaint:  Chief Complaint  Patient presents with  . Tachycardia  . Hypertension   HPI:  Pt is 32 Year old caucasian male with past medical hx of ESRD who came to Er with c/o rapid heart rate. HOPI dates back to 2-3 days ago pt was admitted for HCAP and was found to have Afib w RVR.  Pt was found to have [place] score of 1 and was d/c home on oral Cardizem ( after being stabilized on IV Cardizem)  . Pt also c/o having high Blood Pressure. Pt offers no c/o cough/ fever/ chills No c/o syncope NO c/o change in speech and vision No c/o abdominal pain.    Past Medical History  Diagnosis Date  . Renal disorder   . Blind in both eyes   . Hypertension         Family History  Problem Relation Age of Onset  . Diabetes Mother   . Hypertension Mother   . Diabetes Father   . Hypertension Father   . Heart disease Mother     Died ([facility]) on dialysis  . Heart disease Father   . Kidney failure Father   . Kidney failure Mother     Social History:  reports that he has been smoking Cigarettes.  He has been smoking about 0.25 packs per day. He does not have any smokeless tobacco history on file. He reports that he does not drink alcohol or use illicit drugs.   Allergies:  Allergies  Allergen Reactions  . Bystolic [Nebivolol Hcl] Palpitations    Medications Prior to Admission  Medication Sig Dispense Refill  . acetaminophen (TYLENOL) 500 MG tablet Take 1,000 mg by mouth every 6 (six) hours as needed for pain.       . benzonatate (TESSALON) 100 MG capsule Take 1 capsule (100 mg total) by mouth 3 (three) times daily as needed for cough.  20 capsule  0  . cefUROXime (CEFTIN) 500 MG tablet Take 1 tablet (500 mg total) by mouth 2 (two) times daily with a meal.  10 tablet  0  . diltiazem (CARDIZEM CD) 300 MG 24 hr capsule Take 1 capsule (300 mg total) by  mouth daily.  30 capsule  0  . diphenhydrAMINE (BENADRYL) 25 mg capsule Take 25 mg by mouth every 6 (six) hours as needed for allergies.      . hydrALAZINE (APRESOLINE) 50 MG tablet Take 50 mg by mouth 2 (two) times daily.      Marland Kitchen lanthanum (FOSRENOL) 500 MG chewable tablet Chew 1,000 mg by mouth as needed. Take 2 tabs w/ every meal & 1 w/ every snack.      Marland Kitchen lisinopril (PRINIVIL,ZESTRIL) 40 MG tablet Take 40 mg by mouth daily.      . Omega-3 Fatty Acids (FISH OIL) 1000 MG CAPS Take 2,000 mg by mouth 2 (two) times daily.      . pravastatin (PRAVACHOL) 40 MG tablet Take 40 mg by mouth daily.      . sodium bicarbonate 650 MG tablet Take 650 mg by mouth 2 (two) times daily.           MVH:QIONG from the symptoms mentioned above,there are no other symptoms referable to all systems reviewed.  Marland Kitchen aspirin  325 mg Oral Daily  . atorvastatin  10 mg Oral q1800  . cefUROXime  500 mg Oral BID WC  . heparin  5,000 Units Subcutaneous Q8H  . hydrALAZINE  50 mg Oral BID  . lisinopril  40 mg Oral Daily  . metoprolol tartrate  50 mg Oral BID  . sodium bicarbonate  650 mg Oral BID  . sodium chloride  3 mL Intravenous Q12H      Physical Exam: Vital signs in last 24 hours: Temp:  [97.9 F (36.6 C)-98.5 F (36.9 C)] 98.5 F (36.9 C) (11/26 0730) Pulse Rate:  [141-154] 141 (11/26 0350) Resp:  [10-32] 21 (11/26 1100) BP: (122-189)/(94-111) 141/104 mmHg (11/26 1100) SpO2:  [93 %-98 %] 94 % (11/26 0350) Weight:  [219 lb 9.3 oz (99.6 kg)-220 lb (99.791 kg)] 219 lb 9.3 oz (99.6 kg) (11/26 0400) Weight change:  Last BM Date: 03/31/13  Intake/Output from previous day: 11/25 0701 - 11/26 0700 In: 240 [P.O.:240] Out: -  Total I/O In: 480 [P.O.:480] Out: -    Physical Exam: General- pt is awake,alert, oriented to time place and person Resp- No acute REsp distress, CTA B/L NO Rhonchi CVS- S1S2 Irregular in rate and rhythm GIT- BS+, soft, NT, ND EXT- NO LE Edema, Cyanosis CNS- CN 2-12 grossly  intact. Moving all 4 extremities Psych- normal mood and affect Access- AVF   Lab Results: CBC  Recent Labs  03/31/13 2203 04/01/13 0516  WBC 4.2 5.2  HGB 10.4* 10.0*  HCT 31.0* 30.0*  PLT 131* 134*    BMET  Recent Labs  03/31/13 2203 04/01/13 0516  NA 141 140  K 3.9 4.2  CL 98 98  CO2 27 25  GLUCOSE 120* 116*  BUN 32* 35*  CREATININE 9.84* 10.46*  CALCIUM 9.4 9.4    MICRO Recent Results (from the past 240 hour(s))  CULTURE, BLOOD (ROUTINE X 2)     Status: None   Collection Time    03/27/13 12:31 PM      Result Value Range Status   Specimen Description BLOOD LEFT HAND   Final   Special Requests BOTTLES DRAWN AEROBIC AND ANAEROBIC 8CC   Final   Culture NO GROWTH 5 DAYS   Final   Report Status 04/01/2013 FINAL   Final  CULTURE, BLOOD (ROUTINE X 2)     Status: None   Collection Time    03/27/13 12:31 PM      Result Value Range Status   Specimen Description BLOOD LEFT ANTECUBITAL   Final   Special Requests BOTTLES DRAWN AEROBIC AND ANAEROBIC 15CC   Final   Culture NO GROWTH 5 DAYS   Final   Report Status 04/01/2013 FINAL   Final  MRSA PCR SCREENING     Status: None   Collection Time    03/29/13  6:21 PM      Result Value Range Status   MRSA by PCR NEGATIVE  NEGATIVE Final   Comment:            The GeneXpert MRSA Assay (FDA     approved for NASAL specimens     only), is one component of a     comprehensive MRSA colonization     surveillance program. It is not     intended to diagnose MRSA     infection nor to guide or     monitor treatment for     MRSA infections.      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  on Binders   5)ID-Pt was earleir admitted with HCAP  On PO ABX  Primary MD following   6)Electrolytes  Normokalemic  Hyponatremic   Sec to ESRD   7)Acid base  Co2 at goal   8) CVS-SVT/ Afib  On Cardizem    Plan:  Will dialyze today Will give epo w HD Will use 3 k bath CXR showed some congestion-will try to take 2-3 l off if possible      Ladeidra Borys S 04/01/2013, 1:45 PM

## 2013-04-01 NOTE — Progress Notes (Signed)
UR chart review completed.  

## 2013-04-01 NOTE — H&P (Addendum)
PCP:   Josue Hector, MD   Chief Complaint:  Palpitations  HPI: 32 year old male with a history of end-stage renal disease on hemodialysis, community-acquired pneumonia, new onset atrial fibrillation who was discharged from the hospital yesterday and came back last night with palpitations. Patient was found to be again in atrial fibrillation with RVR. In the previous hospitalization patient was started on Cardizem drip, and transitioned to oral Cardizem and sent home on by mouth Ceftin for pneumonia. In the ED patient was again found to be in A. fib with RVR and hypertension. He denies chest pain no shortness of breath no nausea vomiting or diarrhea.  Allergies:   Allergies  Allergen Reactions  . Bystolic [Nebivolol Hcl] Palpitations      Past Medical History  Diagnosis Date  . Renal disorder   . Blind in both eyes   . Hypertension     Past Surgical History  Procedure Laterality Date  . Eye surgery    . Dialysis fistula creation      Prior to Admission medications   Medication Sig Start Date End Date Taking? Authorizing Provider  acetaminophen (TYLENOL) 500 MG tablet Take 1,000 mg by mouth every 6 (six) hours as needed for pain.    Yes Historical Provider, MD  benzonatate (TESSALON) 100 MG capsule Take 1 capsule (100 mg total) by mouth 3 (three) times daily as needed for cough. 03/31/13  Yes [name], MD  cefUROXime (CEFTIN) 500 MG tablet Take 1 tablet (500 mg total) by mouth 2 (two) times daily with a meal. 03/31/13  Yes [name], MD  diltiazem (CARDIZEM CD) 300 MG 24 hr capsule Take 1 capsule (300 mg total) by mouth daily. 03/31/13  Yes [name], MD  diphenhydrAMINE (BENADRYL) 25 mg capsule Take 25 mg by mouth every 6 (six) hours as needed for allergies.   Yes Historical Provider, MD  hydrALAZINE (APRESOLINE) 50 MG tablet Take 50 mg by mouth 2 (two) times daily.   Yes Historical Provider, MD  lanthanum (FOSRENOL) 500 MG chewable tablet Chew 1,000  mg by mouth as needed. Take 2 tabs w/ every meal & 1 w/ every snack.   Yes Historical Provider, MD  lisinopril (PRINIVIL,ZESTRIL) 40 MG tablet Take 40 mg by mouth daily.   Yes Historical Provider, MD  Omega-3 Fatty Acids (FISH OIL) 1000 MG CAPS Take 2,000 mg by mouth 2 (two) times daily.   Yes Historical Provider, MD  pravastatin (PRAVACHOL) 40 MG tablet Take 40 mg by mouth daily.   Yes Historical Provider, MD  sodium bicarbonate 650 MG tablet Take 650 mg by mouth 2 (two) times daily.   Yes Historical Provider, MD    Social History:  reports that he has been smoking Cigarettes.  He has been smoking about 0.25 packs per day. He does not have any smokeless tobacco history on file. He reports that he does not drink alcohol or use illicit drugs.  Family History  Problem Relation Age of Onset  . Diabetes Mother   . Hypertension Mother   . Diabetes Father   . Hypertension Father   . Heart disease Mother     Died ([facility]) on dialysis  . Heart disease Father   . Kidney failure Father   . Kidney failure Mother      All the positives are listed in BOLD  Review of Systems:  HEENT: Headache, , runny nose, sore throat, blindness Neck: Hypothyroidism, hyperthyroidism,,lymphadenopathy Chest : Shortness of breath, history of COPD, Asthma Heart : Chest pain,  history of coronary arterey disease GI:  Nausea, vomiting, diarrhea, constipation, GERD GU: Dysuria, urgency, frequency of urination, hematuria Neuro: Stroke, seizures, syncope Psych: Depression, anxiety, hallucinations   Physical Exam: Blood pressure 158/106, pulse 152, temperature 97.9 F (36.6 C), temperature source Oral, resp. rate 20, height 5\' 8"  (1.727 m), weight 99.791 kg (220 lb), SpO2 97.00%. Constitutional:   Patient is a well-developed and well-nourished *male in no acute distress and cooperative with exam. Head: Normocephalic and atraumatic Mouth: Mucus membranes moist Eyes: Bilateral blindness Neck: Supple, No  Thyromegaly Cardiovascular: RRR, S1 normal, S2 normal Pulmonary/Chest: CTAB, no wheezes, rales, or rhonchi Abdominal: Soft. Non-tender, non-distended, bowel sounds are normal, no masses, organomegaly, or guarding present.  Neurological: A&O x3, Strenght is normal and symmetric bilaterally, cranial nerve II-XII are grossly intact, no focal motor deficit, sensory intact to light touch bilaterally.  Extremities : No Cyanosis, Clubbing or Edema   Labs on Admission:  Results for orders placed during the hospital encounter of 03/31/13 (from the past 48 hour(s))  CBC WITH DIFFERENTIAL     Status: Abnormal   Collection Time    03/31/13 10:03 PM      Result Value Range   WBC 4.2  4.0 - 10.5 K/uL   RBC 3.46 (*) 4.22 - 5.81 MIL/uL   Hemoglobin 10.4 (*) 13.0 - 17.0 g/dL   HCT 40.9 (*) 81.1 - 91.4 %   MCV 89.6  78.0 - 100.0 fL   MCH 30.1  26.0 - 34.0 pg   MCHC 33.5  30.0 - 36.0 g/dL   RDW 78.2  95.6 - 21.3 %   Platelets 131 (*) 150 - 400 K/uL   Neutrophils Relative % 71  43 - 77 %   Neutro Abs 3.0  1.7 - 7.7 K/uL   Lymphocytes Relative 17  12 - 46 %   Lymphs Abs 0.7  0.7 - 4.0 K/uL   Monocytes Relative 7  3 - 12 %   Monocytes Absolute 0.3  0.1 - 1.0 K/uL   Eosinophils Relative 4  0 - 5 %   Eosinophils Absolute 0.2  0.0 - 0.7 K/uL   Basophils Relative 1  0 - 1 %   Basophils Absolute 0.0  0.0 - 0.1 K/uL  COMPREHENSIVE METABOLIC PANEL     Status: Abnormal   Collection Time    03/31/13 10:03 PM      Result Value Range   Sodium 141  135 - 145 mEq/L   Potassium 3.9  3.5 - 5.1 mEq/L   Chloride 98  96 - 112 mEq/L   CO2 27  19 - 32 mEq/L   Glucose, Bld 120 (*) 70 - 99 mg/dL   BUN 32 (*) 6 - 23 mg/dL   Creatinine, Ser 0.86 (*) 0.50 - 1.35 mg/dL   Calcium 9.4  8.4 - 57.8 mg/dL   Total Protein 7.1  6.0 - 8.3 g/dL   Albumin 3.3 (*) 3.5 - 5.2 g/dL   AST 26  0 - 37 U/L   ALT 22  0 - 53 U/L   Alkaline Phosphatase 66  39 - 117 U/L   Total Bilirubin 0.3  0.3 - 1.2 mg/dL   GFR calc non Af Amer 6  (*) >90 mL/min   GFR calc Af Amer 7 (*) >90 mL/min   Comment: (NOTE)     The eGFR has been calculated using the CKD EPI equation.     This calculation has not been validated in all clinical situations.  eGFR's persistently <90 mL/min signify possible Chronic Kidney     Disease.  TROPONIN I     Status: None   Collection Time    03/31/13 10:03 PM      Result Value Range   Troponin I <0.30  <0.30 ng/mL   Comment:            Due to the release kinetics of cTnI,     a negative result within the first hours     of the onset of symptoms does not rule out     myocardial infarction with certainty.     If myocardial infarction is still suspected,     repeat the test at appropriate intervals.    Radiological Exams on Admission: Dg Chest Portable 1 View  03/31/2013   CLINICAL DATA:  Tachycardia and increased blood pressure tonight. Hospitalized since Friday for pneumonia and discharge today.  EXAM: PORTABLE CHEST - 1 VIEW  COMPARISON:  03/29/2013  FINDINGS: Cardiac enlargement with prominent pulmonary vascularity suggesting mild congestion. Infiltration in the left lung base appears stable since previous study and is consistent with pneumonia. No blunting of costophrenic angles. No pneumothorax.  IMPRESSION: Cardiac enlargement with mild pulmonary vascular congestion. No edema. Left lower lung infiltration consistent with pneumonia appear stable.   Electronically Signed   By: Burman Nieves M.D.   On: 03/31/2013 22:44    Assessment/Plan Active Problems:   HTN (hypertension)   HCAP (healthcare-associated pneumonia)   Tachycardia   Atrial fibrillation with RVR  A. fib with RVR-patient has been started on Cardizem drip. Heart rate is still elevated, will start metoprolol 50 mg by mouth twice a day. Continue with Cardizem drip and will get cartilage consultation in the morning. Patient will be continued on aspirin 325 mg by mouth daily. CHA DS score is 1.  End-stage renal disease- patient is  on dialysis. Will consult nephrology in a.m. for hemodialysis  Hypertension- continue home medications. As her pressure is uncontrolled will also add metoprolol 50 g by mouth twice a day.   Pneumonia- patient was discharged on Ceftin. Will continue Ceftin at this time  Code status: Presumed full code  Family discussion: Discussed with patient's wife at bedside   Time Spent on Admission: 60 min  Camrin Lapre S [facility] Pager: [phone] 04/01/2013, 1:20 AM  If 7PM-7AM, please contact night-coverage  [email or web address]  Password [email or web address]

## 2013-04-01 NOTE — Care Management Note (Signed)
    Page 1 of 1   04/01/2013     7:50:53 AM   CARE MANAGEMENT NOTE 04/01/2013  Patient:  AUSTINE, WIEDEMAN   Account Number:  [id number]  Date Initiated:  04/01/2013  Documentation initiated by:  Sharrie Rothman  Subjective/Objective Assessment:   PT admitted from home with recurrent a fib. Pt lives with his girlfriend and will return home at discharge. Pt is independent with ADL's and has a cane for prn use (pt legally blind). Pt does receive dialysis 3 times a week at [facility] in [place]     Action/Plan:   No CM needs noted.   Anticipated DC Date:  04/04/2013   Anticipated DC Plan:  HOME/SELF CARE      DC Planning Services  CM consult      Choice offered to / List presented to:             Status of service:  Completed, signed off Medicare Important Message given?   (If response is "NO", the following Medicare IM given date fields will be blank) Date Medicare IM given:   Date Additional Medicare IM given:    Discharge Disposition:  HOME/SELF CARE  Per UR Regulation:    If discussed at Long Length of Stay Meetings, dates discussed:    Comments:  04/01/13 0750 Arlyss Queen, RN BSN CM

## 2013-04-01 NOTE — Progress Notes (Addendum)
DR Baldemar Friday NOTIFIED OF PT CONVERTING TO NSR

## 2013-04-01 NOTE — Progress Notes (Signed)
SUBJECTIVE: Patient was discharged yesterday in a normal sinus rhythm. However, he said his BP was markedly elevated at the time of discharge. He had been taking labetalol at home but was not given it on discharge, along with amlodipine. He went home and said he didn't feel well. He went to sleep and woke up feeling fatigued "and just not feeling right". His BP was checked and found to be 215/110 mmHg with a HR of 184 bpm. His insurance did not cover the long-acting diltiazem but he was administered this prior to discharge.  He is currently on a diltiazem infusion at 15 mg/hr, along with metoprolol 50 mg bid. His BP is under better control on hydralazine and lisinopril. He feels his breathing has markedly improved.     Intake/Output Summary (Last 24 hours) at 04/01/13 1437 Last data filed at 04/01/13 1000  Gross per 24 hour  Intake    720 ml  Output      0 ml  Net    720 ml    Current Facility-Administered Medications  Medication Dose Route Frequency Provider Last Rate Last Dose  . 0.9 %  sodium chloride infusion  250 mL Intravenous PRN Meredeth Ide, MD      . aspirin tablet 325 mg  325 mg Oral Daily Meredeth Ide, MD   325 mg at 04/01/13 1009  . atorvastatin (LIPITOR) tablet 10 mg  10 mg Oral q1800 Standley Brooking, MD      . benzonatate (TESSALON) capsule 100 mg  100 mg Oral TID PRN Meredeth Ide, MD   100 mg at 04/01/13 0526  . cefUROXime (CEFTIN) tablet 500 mg  500 mg Oral BID WC Meredeth Ide, MD   500 mg at 04/01/13 0814  . diltiazem (CARDIZEM) 100 mg in dextrose 5 % 100 mL infusion  5 mg/hr Intravenous Continuous Donnetta Hutching, MD 5 mL/hr at 04/01/13 1257 5 mg/hr at 04/01/13 1257  . diphenhydrAMINE (BENADRYL) capsule 25 mg  25 mg Oral Q6H PRN Meredeth Ide, MD      . epoetin alfa (EPOGEN,PROCRIT) injection 4,000 Units  4,000 Units Intravenous 3 times weekly Manpreet Lucio Edward, MD      . heparin injection 5,000 Units  5,000 Units Subcutaneous Q8H Meredeth Ide, MD   5,000 Units  at 04/01/13 1257  . hydrALAZINE (APRESOLINE) tablet 50 mg  50 mg Oral BID Meredeth Ide, MD   50 mg at 04/01/13 1009  . lisinopril (PRINIVIL,ZESTRIL) tablet 40 mg  40 mg Oral Daily Meredeth Ide, MD   40 mg at 04/01/13 1009  . metoprolol (LOPRESSOR) tablet 50 mg  50 mg Oral BID Meredeth Ide, MD   50 mg at 04/01/13 1009  . sodium bicarbonate tablet 650 mg  650 mg Oral BID Meredeth Ide, MD   650 mg at 04/01/13 1009  . sodium chloride 0.9 % injection 3 mL  3 mL Intravenous Q12H Meredeth Ide, MD      . sodium chloride 0.9 % injection 3 mL  3 mL Intravenous PRN Meredeth Ide, MD        Filed Vitals:   04/01/13 0930 04/01/13 1000 04/01/13 1030 04/01/13 1100  BP:   151/99 141/104  Pulse:      Temp:      TempSrc:      Resp: 14 18 12 21   Height:      Weight:      SpO2:  PHYSICAL EXAM General: NAD Neck: No JVD, no thyromegaly or thyroid nodule.  Lungs: Clear to auscultation bilaterally with normal respiratory effort. CV: Nondisplaced PMI.  Irregular rhythm, normal S1/S2, no S3/S4, no murmur.  No pretibial edema.  No carotid bruit.  Normal pedal pulses.  Abdomen: Soft, nontender, no hepatosplenomegaly, no distention.  Neurologic: Alert and oriented x 3.  Psych: Normal affect. Extremities: No clubbing or cyanosis.   TELEMETRY: Reviewed telemetry pt in atrial fibrillation, HR 70 bpm range.  LABS: Basic Metabolic Panel:  Recent Labs  91/47/82 2203 04/01/13 0516  NA 141 140  K 3.9 4.2  CL 98 98  CO2 27 25  GLUCOSE 120* 116*  BUN 32* 35*  CREATININE 9.84* 10.46*  CALCIUM 9.4 9.4   Liver Function Tests:  Recent Labs  03/31/13 2203 04/01/13 0516  AST 26 26  ALT 22 22  ALKPHOS 66 66  BILITOT 0.3 0.3  PROT 7.1 6.8  ALBUMIN 3.3* 3.0*   No results found for this basename: LIPASE, AMYLASE,  in the last 72 hours CBC:  Recent Labs  03/31/13 2203 04/01/13 0516  WBC 4.2 5.2  NEUTROABS 3.0  --   HGB 10.4* 10.0*  HCT 31.0* 30.0*  MCV 89.6 90.1  PLT 131* 134*    Cardiac Enzymes:  Recent Labs  03/29/13 2345 03/30/13 0553 03/31/13 2203  TROPONINI <0.30 <0.30 <0.30   BNP: No components found with this basename: POCBNP,  D-Dimer: No results found for this basename: DDIMER,  in the last 72 hours Hemoglobin A1C: No results found for this basename: HGBA1C,  in the last 72 hours Fasting Lipid Panel: No results found for this basename: CHOL, HDL, LDLCALC, TRIG, CHOLHDL, LDLDIRECT,  in the last 72 hours Thyroid Function Tests: No results found for this basename: TSH, T4TOTAL, FREET3, T3FREE, THYROIDAB,  in the last 72 hours Anemia Panel: No results found for this basename: VITAMINB12, FOLATE, FERRITIN, TIBC, IRON, RETICCTPCT,  in the last 72 hours  RADIOLOGY: Dg Chest 2 View  03/29/2013   CLINICAL DATA:  Pneumonia.  EXAM: CHEST  2 VIEW  COMPARISON:  March 27, 2013.  FINDINGS: Stable cardiomegaly. Right lung is clear. Left lower lobe airspace opacity is slightly improved consistent with pneumonia. No pneumothorax or pleural effusion is noted. Bony thorax is intact.  IMPRESSION: Slightly improved left lower lobe pneumonia.   Electronically Signed   By: Roque Lias M.D.   On: 03/29/2013 10:45   Dg Chest 2 View  03/27/2013   CLINICAL DATA:  Cough, shortness of breath  EXAM: CHEST  2 VIEW  COMPARISON:  03/04/2007  FINDINGS: Enlargement of cardiac silhouette.  Mediastinal contours normal.  Mild pulmonary vascular congestion.  Extensive airspace infiltrate identified in both the left upper and left lower lobes most consistent with pneumonia.  Central peribronchial thickening noted.  Right lung grossly clear.  Question component of pleural effusion at lateral left lung base.  No pneumothorax or acute osseous findings.  IMPRESSION: Enlargement of cardiac silhouette.  Extensive infiltrate in left upper and left lower lobes most consistent with pneumonia.  Question small left pleural effusion.   Electronically Signed   By: Ulyses Southward M.D.   On: 03/27/2013  10:52   Dg Chest Portable 1 View  03/31/2013   CLINICAL DATA:  Tachycardia and increased blood pressure tonight. Hospitalized since Friday for pneumonia and discharge today.  EXAM: PORTABLE CHEST - 1 VIEW  COMPARISON:  03/29/2013  FINDINGS: Cardiac enlargement with prominent pulmonary vascularity suggesting mild congestion. Infiltration in  the left lung base appears stable since previous study and is consistent with pneumonia. No blunting of costophrenic angles. No pneumothorax.  IMPRESSION: Cardiac enlargement with mild pulmonary vascular congestion. No edema. Left lower lung infiltration consistent with pneumonia appear stable.   Electronically Signed   By: Burman Nieves M.D.   On: 03/31/2013 22:44      ASSESSMENT AND PLAN: 1. Atrial fibrillation: I will start short-acting diltiazem 60 mg bid and wean off his drip. I will switch metoprolol to labetalol 300 mg bid, which is what he had apparently been taking prior to his first hospitalization, as this will help with both HR and BP control. I will also start an amiodarone infusion in the hopes of cardioverting him. This may have been triggered by both resolving HCAP and markedly elevated BP, leading to increased left atrial pressures.  He has a very mild degree of pulmonary vascular congestion, without overt diastolic heart failure. Given this and his age, with a CHADS-Vasc score otherwise of 1 (HTN), I do not feel he should be committed to lifelong anticoagulation at this time. This can be reevaluated in the future if he were to have florid diastolic heart failure.  2. HTN: continue hydralazine and lisinopril for now. Medication changes as above. 3. HCAP: on antibiotics, and appears to have resolved to a significant degree. 4. ESRD: on dialysis.   Prentice Docker, M.D., F.A.C.C.

## 2013-04-01 NOTE — Progress Notes (Signed)
[facility] PROGRESS NOTE  Justin Ortega ZOX:[id number] DOB: December 15, 1980 DOA: 03/31/2013 PCP: Josue Hector, MD  Assessment/Plan: 1. Atrial fibrillation with rapid ventricular response: Now appears to be in atrial flutter. Rate remains poorly controlled on diltiazem infusion the patient is asymptomatic. Continue diltiazem infusion. Continue Lopressor. Cardiology consultation. Diagnosed last hospitalization just a few days ago. Anticoagulation not indicated based on CHADS-Vasc score of 1. Cardiology recommended oral diltiazem at that time. 2. Hypertensive urgency: Asymptomatic. Overall improved. Continue antihypertensives. 3. End-stage renal disease: Nephrology consultation for routine hemodialysis. 4. Recent diagnosis of pneumonia: Appears clinically resolved. Continue Ceftin. 5. Thrombocytopenia: Appears stable. Monitor clinically. Possibly related to recent infection. 6. Normocytic anemia: Stable. 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   Cardiology consultation for further recommendations, continue diltiazem and metoprolol.  Pending studies:   none  Code Status: full DVT prophylaxis: heparin Family Communication: Discussed with wife at bedside Disposition Plan: home when improved  Brendia Sacks, MD  [facility]  Pager [phone] If 7PM-7AM, please contact night-coverage at [email or web address], password [facility] 04/01/2013, 8:08 AM  LOS: 1 day   Summary: 32 year old male with history of end-stage renal disease on hemodialysis recently discharged 11/25, treated for pneumonia, hypoxic respiratory failure as well as new onset atrial fibrillation who presents to the hospital several hours after discharge with atrial fibrillation with rapid ventricular response. He was admitted for A. fib with RVR in cardiology consultation and treatment of blood pressure.  Consultants:  Cardiology  Procedures:    Antibiotics:  Ceftin  HPI/Subjective: No  shortness of breath, no chest pain. Some palpitations but no other complaints. No nausea or vomiting.  Objective: Filed Vitals:   04/01/13 0545 04/01/13 0600 04/01/13 0615 04/01/13 0630  BP:      Pulse:      Temp:      TempSrc:      Resp: 27 10 14 15   Height:      Weight:      SpO2:        Intake/Output Summary (Last 24 hours) at 04/01/13 0808 Last data filed at 04/01/13 0430  Gross per 24 hour  Intake    240 ml  Output      0 ml  Net    240 ml     Filed Weights   03/31/13 2133 04/01/13 0400  Weight: 99.791 kg (220 lb) 99.6 kg (219 lb 9.3 oz)    Exam:   Afebrile. Blood pressure stable. Remains tachycardic.  Appears calm and comfortable. Speech fluent and clear.  Eyes: Legally blind. Pupils not reactive. Pupils round. Lids appear unremarkable.  ENT: Lips and tongue appear unremarkable.  Cardiovascular: Irregular, tachycardic, no murmur rub or gallop. 1+ bilateral lower extremity edema.  Telemetry: Atrial flutter with rate up to 140s  Respiratory: Clear to auscultation bilaterally. No wheezes, rales or rhonchi. Normal respiratory effort.  Abdomen: Soft, nontender, nondistended.  Skin: Appears unremarkable grossly.  Data Reviewed:  Basic metabolic panel consistent with end-stage renal disease   thrombocytopenia stable compared to previous values  Normocytic anemia stable    chest x-ray no edema, left lower lung infiltration consistent with pneumonia appears stable  EKG with atrial flutter  Scheduled Meds: . aspirin  325 mg Oral Daily  . cefUROXime  500 mg Oral BID WC  . heparin  5,000 Units Subcutaneous Q8H  . hydrALAZINE  50 mg Oral BID  . lisinopril  40 mg Oral Daily  . metoprolol tartrate  50 mg Oral BID  . simvastatin  20 mg Oral q1800  . sodium bicarbonate  650 mg Oral BID  . sodium chloride  3 mL Intravenous Q12H   Continuous Infusions: . diltiazem (CARDIZEM) infusion 10 mg/hr (04/01/13 0454)    Active Problems:   HTN (hypertension)    HCAP (healthcare-associated pneumonia)   Tachycardia   Atrial fibrillation with RVR   Time spent 25 minutes

## 2013-04-01 NOTE — Progress Notes (Signed)
Pt is now in nsr.  12 lead ekg will be obtained.

## 2013-04-02 LAB — CBC
HCT: 25.9 % — ABNORMAL LOW (ref 39.0–52.0)
Hemoglobin: 8.7 g/dL — ABNORMAL LOW (ref 13.0–17.0)
MCH: 30.1 pg (ref 26.0–34.0)
MCHC: 33.6 g/dL (ref 30.0–36.0)
MCV: 89.6 fL (ref 78.0–100.0)
Platelets: 147 10*3/uL — ABNORMAL LOW (ref 150–400)
RBC: 2.89 MIL/uL — ABNORMAL LOW (ref 4.22–5.81)
RDW: 12.7 % (ref 11.5–15.5)
WBC: 5 10*3/uL (ref 4.0–10.5)

## 2013-04-02 LAB — BASIC METABOLIC PANEL
BUN: 18 mg/dL (ref 6–23)
CO2: 31 mEq/L (ref 19–32)
Calcium: 9.4 mg/dL (ref 8.4–10.5)
Chloride: 97 mEq/L (ref 96–112)
Creatinine, Ser: 6.36 mg/dL — ABNORMAL HIGH (ref 0.50–1.35)
GFR calc Af Amer: 12 mL/min — ABNORMAL LOW (ref >90)
GFR calc non Af Amer: 10 mL/min — ABNORMAL LOW (ref >90)
Glucose, Bld: 176 mg/dL — ABNORMAL HIGH (ref 70–99)
Potassium: 3.6 mEq/L (ref 3.5–5.1)
Sodium: 139 mEq/L (ref 135–145)

## 2013-04-02 LAB — PHOSPHORUS: Phosphorus: 2.8 mg/dL (ref 2.3–4.6)

## 2013-04-02 MED ORDER — AMIODARONE HCL 200 MG PO TABS
200.0000 mg | ORAL_TABLET | Freq: Every day | ORAL | Status: DC
  Administered 2013-04-02 – 2013-04-03 (×2): 200 mg via ORAL
  Filled 2013-04-02 (×3): qty 1

## 2013-04-02 MED ORDER — LABETALOL HCL 200 MG PO TABS
600.0000 mg | ORAL_TABLET | Freq: Two times a day (BID) | ORAL | Status: DC
  Administered 2013-04-02 – 2013-04-03 (×3): 600 mg via ORAL
  Filled 2013-04-02 (×3): qty 3

## 2013-04-02 NOTE — Progress Notes (Signed)
Pt transfering to room 324 on telemetry. Hr 74 in nsr. bp 167/76. Lung sounds diminished. occassional productive cough. Rt forearm iv patent ans nsl. Denies any discomfort. Transfer report given to Carolynn Sayers RN

## 2013-04-02 NOTE — Progress Notes (Signed)
[facility] PROGRESS NOTE  Hubbard DUCRE AOZ:[id number] DOB: 28-May-1980 DOA: 03/31/2013 PCP: Josue Hector, MD  Assessment/Plan: 1. Atrial fibrillation with rapid ventricular response: Converted to sinus rhythm on amiodarone infusion. Cardiology consultation appreciated. Anticoagulation not indicated based on CHADS-Vasc score of 1.  2. Hypertensive urgency: Asymptomatic, resolved. Continue antihypertensives. 3. End-stage renal disease: Routine hemodialysis. 4. Recent diagnosis of pneumonia: Appears clinically resolved. Continue Ceftin. 5. Thrombocytopenia: Improving. Monitor clinically. Possibly related to recent infection. 6. Normocytic anemia: Likely anemia of chronic disease secondary to renal disease.   Discussed with Dr. Kristian Covey, labetalol has been increased, Cardizem discontinued secondary to financial considerations.  Discontinued amiodarone infusion.  Monitor clinically.  If remains sinus rhythm, anticipated discharge 11/28.  Pending studies:   none  Code Status: full DVT prophylaxis: heparin Family Communication: Discussed with girlfriend at bedside Disposition Plan: home when improved  Brendia Sacks, MD  [facility]  Pager [phone] If 7PM-7AM, please contact night-coverage at [email or web address], password [facility] 04/02/2013, 9:11 AM  LOS: 2 days   Summary: 32 year old male with history of end-stage renal disease on hemodialysis recently discharged 11/25, treated for pneumonia, hypoxic respiratory failure as well as new onset atrial fibrillation who presents to the hospital several hours after discharge with atrial fibrillation with rapid ventricular response. He was admitted for A. fib with RVR in cardiology consultation and treatment of blood pressure.  Consultants:  Cardiology  Procedures:    Antibiotics:  Ceftin  HPI/Subjective: Converted to sinus rhythm last night on amiodarone infusion. No issues today. Feels fine. No chest pain or  shortness of breath.  Objective: Filed Vitals:   04/02/13 0630 04/02/13 0700 04/02/13 0730 04/02/13 0800  BP: 176/82 176/87 157/77 157/63  Pulse:      Temp:    98 F (36.7 C)  TempSrc:      Resp: 15 15 22 21   Height:      Weight:      SpO2:        Intake/Output Summary (Last 24 hours) at 04/02/13 0911 Last data filed at 04/02/13 0800  Gross per 24 hour  Intake 1690.82 ml  Output   3000 ml  Net -1309.18 ml     Filed Weights   03/31/13 2133 04/01/13 0400 04/01/13 1940  Weight: 99.791 kg (220 lb) 99.6 kg (219 lb 9.3 oz) 100.9 kg (222 lb 7.1 oz)    Exam:   Afebrile. Vitals stable.  Telemetry: Sinus rhythm.  Cardiovascular: Regular rate and rhythm. No murmur, rub gallop.  Respiratory: Clear to auscultation bilaterally. No wheezes, rales or rhonchi. Normal respiratory effort.  Psychiatric: Grossly normal mood and affect. Speech fluent and appropriate.  Data Reviewed:  Basic metabolic panel consistent with end-stage renal disease   Platelet count improved, 147  Hemoglobin 8.7, near baseline  Scheduled Meds: . amiodarone  150 mg Intravenous Once  . aspirin  325 mg Oral Daily  . atorvastatin  10 mg Oral q1800  . cefUROXime  500 mg Oral BID WC  . epoetin alfa  4,000 Units Intravenous 3 times weekly  . heparin  5,000 Units Subcutaneous Q8H  . hydrALAZINE  50 mg Oral BID  . labetalol  600 mg Oral BID  . lisinopril  40 mg Oral Daily  . sodium bicarbonate  650 mg Oral BID  . sodium chloride  3 mL Intravenous Q12H   Continuous Infusions: . amiodarone (NEXTERONE PREMIX) 360 mg/200 mL dextrose 30 mg/hr (04/02/13 0800)    Active Problems:   HTN (hypertension)   HCAP (healthcare-associated pneumonia)  Tachycardia   Atrial fibrillation with RVR   Time spent 20 minutes

## 2013-04-02 NOTE — Procedures (Signed)
   HEMODIALYSIS TREATMENT NOTE;  4 hour heparinized dialysis completed via left upper arm AVF (15g/antegrade).  Goal met:  Tolerated removal of 3 liters with no interruption in ultrafiltration.  SVS was discontinued when SBP>200.  Antihypertensives were given at end of treatment.  All blood was reinfused and hemostasis was achieved within .  Report given to [place], Charity fundraiser.  Artice Holohan L. Panayiota Larkin, RN, CDN

## 2013-04-02 NOTE — Progress Notes (Signed)
Amiodarone drip stopped. Pt will now be on po pacerone

## 2013-04-02 NOTE — Progress Notes (Signed)
Subjective: Interval History: has no complaint of headache. He denies any difficulty in breathing. Overall patient says that he'Ortega feeling good..  Objective: Vital signs in last 24 hours: Temp:  [98 F (36.7 C)-98.1 F (36.7 C)] 98 F (36.7 C) (11/27 0800) Pulse Rate:  [70-78] 77 (11/27 0024) Resp:  [12-29] 21 (11/27 0800) BP: (129-203)/(55-104) 157/63 mmHg (11/27 0800) SpO2:  [99 %] 99 % (11/26 1940) Weight:  [100.9 kg (222 lb 7.1 oz)] 100.9 kg (222 lb 7.1 oz) (11/26 1940) Weight change: 1.109 kg (2 lb 7.1 oz)  Intake/Output from previous day: 11/26 0701 - 11/27 0700 In: 1454.1 [P.O.:960; I.V.:494.1] Out: 3000  Intake/Output this shift: Total I/O In: 256.7 [P.O.:240; I.V.:16.7] Out: -   General appearance: alert, cooperative and no distress Resp: clear to auscultation bilaterally Cardio: regular rate and rhythm, S1, S2 normal, no murmur, click, rub or gallop GI: soft, non-tender; bowel sounds normal; no masses,  no organomegaly Extremities: extremities normal, atraumatic, no cyanosis or edema  Lab Results:  Recent Labs  04/01/13 0516 04/02/13 0432  WBC 5.2 5.0  HGB 10.0* 8.7*  HCT 30.0* 25.9*  PLT 134* 147*   BMET:  Recent Labs  04/01/13 0516 04/02/13 0432  NA 140 139  K 4.2 3.6  CL 98 97  CO2 25 31  GLUCOSE 116* 176*  BUN 35* 18  CREATININE 10.46* 6.36*  CALCIUM 9.4 9.4   No results found for this basename: PTH,  in the last 72 hours Iron Studies: No results found for this basename: IRON, TIBC, TRANSFERRIN, FERRITIN,  in the last 72 hours  Studies/Results: Dg Chest Portable 1 View  03/31/2013   CLINICAL DATA:  Tachycardia and increased blood pressure tonight. Hospitalized since Friday for pneumonia and discharge today.  EXAM: PORTABLE CHEST - 1 VIEW  COMPARISON:  03/29/2013  FINDINGS: Cardiac enlargement with prominent pulmonary vascularity suggesting mild congestion. Infiltration in the left lung base appears stable since previous study and is  consistent with pneumonia. No blunting of costophrenic angles. No pneumothorax.  IMPRESSION: Cardiac enlargement with mild pulmonary vascular congestion. No edema. Left lower lung infiltration consistent with pneumonia appear stable.   Electronically Signed   By: Burman Nieves M.D.   On: 03/31/2013 22:44    I have reviewed the patient'Ortega current medications.  Assessment/Plan: Problem #1 end-stage renal disease is status post hemodialysis yesterday his BUN and creatinine is was in acceptable range. His normal potassium and patient does not any nausea vomiting. Problem #2 after fibrillation presently patient is in the sinuses rythm . Patient on labetalol and Cardiazam. And his heart rate is controlled. Patient was not able to get his Cardizem as outpatient possibly that might have contributed to his a controlled hypertension and his heart rate. Problem #3 pneumonia patient has been on antibiotics and presently feels better. Problem #4 metabolic bone disease his calcium and phosphorus is reasonably controlled Problem #5 anemia his hemoglobin and hematocrit has declined. Patient presently on Epogen. Problem #6 history of diabetes Problem #7 history of diabetic retinopathy. Plan: Because of financial issues and difficulty of high copayment we will increase his labetalol to 600 mg by mouth twice a day and DC Cardizem. If his heart rate is controlled and blood pressure becomes high possibly would put him back on amlodipine as long as his heart rate is was in acceptable range.             We'll check his basic metabolic panel and CBC in the morning.  LOS: 2 days   Justin Ortega 04/02/2013,8:36 AM

## 2013-04-03 DIAGNOSIS — D649 Anemia, unspecified: Secondary | ICD-10-CM

## 2013-04-03 LAB — BASIC METABOLIC PANEL
Calcium: 9.3 mg/dL (ref 8.4–10.5)
Creatinine, Ser: 9.22 mg/dL — ABNORMAL HIGH (ref 0.50–1.35)
GFR calc Af Amer: 8 mL/min — ABNORMAL LOW (ref >90)
GFR calc non Af Amer: 7 mL/min — ABNORMAL LOW (ref >90)
Sodium: 140 mEq/L (ref 135–145)

## 2013-04-03 LAB — CBC
HCT: 24.2 % — ABNORMAL LOW (ref 39.0–52.0)
Hemoglobin: 8.3 g/dL — ABNORMAL LOW (ref 13.0–17.0)
MCH: 30.9 pg (ref 26.0–34.0)
MCHC: 34.3 g/dL (ref 30.0–36.0)
MCV: 90 fL (ref 78.0–100.0)
Platelets: 135 10*3/uL — ABNORMAL LOW (ref 150–400)
RBC: 2.69 MIL/uL — ABNORMAL LOW (ref 4.22–5.81)
RDW: 12.8 % (ref 11.5–15.5)
WBC: 4.4 10*3/uL (ref 4.0–10.5)

## 2013-04-03 LAB — PHOSPHORUS: Phosphorus: 4.1 mg/dL (ref 2.3–4.6)

## 2013-04-03 MED ORDER — ASPIRIN 325 MG PO TABS: 325.0000 mg | ORAL_TABLET | Freq: Every day | ORAL | Status: AC

## 2013-04-03 MED ORDER — AMIODARONE HCL 200 MG PO TABS: 200.0000 mg | ORAL_TABLET | Freq: Every day | ORAL | Status: DC

## 2013-04-03 MED ORDER — LABETALOL HCL 300 MG PO TABS: 600.0000 mg | ORAL_TABLET | Freq: Two times a day (BID) | ORAL | Status: AC

## 2013-04-03 MED ORDER — AMLODIPINE BESYLATE 5 MG PO TABS
10.0000 mg | ORAL_TABLET | Freq: Every day | ORAL | Status: DC
  Administered 2013-04-03: 10 mg via ORAL
  Filled 2013-04-03: qty 2

## 2013-04-03 MED ORDER — AMLODIPINE BESYLATE 10 MG PO TABS: 10.0000 mg | ORAL_TABLET | Freq: Every day | ORAL | Status: AC

## 2013-04-03 MED ORDER — ACETAMINOPHEN 500 MG PO TABS: 500.0000 mg | ORAL_TABLET | Freq: Four times a day (QID) | ORAL | Status: DC | PRN

## 2013-04-03 MED ORDER — EPOETIN ALFA 4000 UNIT/ML IJ SOLN: 4000.0000 [IU] | INTRAMUSCULAR | Status: DC

## 2013-04-03 NOTE — Progress Notes (Signed)
Discharge instructions given with all questions answered. Patients oxygen saturation 94% on RA with no complaints of SOB. Prescriptions given. Patient left in stable condition with girlfriend via wheelchair.

## 2013-04-03 NOTE — Discharge Summary (Signed)
Physician Discharge Summary  HELTON OLESON RUE:[id number] DOB: 12/15/1980 DOA: 03/31/2013  PCP: Josue Hector, MD  Admit date: 03/31/2013 Discharge date: 04/03/2013  Recommendations for Outpatient Follow-up:  1. Followup atrial fibrillation/flutter, see discussion below. 2. Followup hypertension, see discussion below 3. Followup mild thrombocytopenia, consider repeat CBC as an outpatient  Follow-up Information   Follow up with Josue Hector, MD In 1 week.   Specialty:  Family Medicine   Contact information:   [address] [place] [address] [phone]       Follow up with Laqueta Linden, MD. (office will contact you for appointment)    Specialty:  Cardiology   Contact information:   [address] [place] [address] [phone]      Discharge Diagnoses:  1. Atrial fibrillation/flutter with rapid ventricular response 2. Hypertensive urgency 3. End-stage renal disease 4. Thrombocytopenia 5. Normocytic anemia 6. Recent diagnosis of pneumonia prior to admission  Discharge Condition: Improved Disposition: Home  Diet recommendation: Heart healthy  Filed Weights   04/01/13 0400 04/01/13 1940 04/03/13 0601  Weight: 99.6 kg (219 lb 9.3 oz) 100.9 kg (222 lb 7.1 oz) 98.431 kg (217 lb)    History of present illness:  32 year old male with history of end-stage renal disease on hemodialysis recently discharged 11/25, treated for pneumonia, hypoxic respiratory failure as well as new onset atrial fibrillation who presents to the hospital several hours after discharge with atrial fibrillation with rapid ventricular response. He was admitted for A. fib with RVR in cardiology consultation and treatment of blood pressure.  Hospital Course:  Mr. Mcleary was admitted for further evaluation and treatment of atrial fibrillation/flutter with rapid ventricular response as well as hypertensive urgency. He was seen in consultation with cardiology. Rate control  could not be achieved with diltiazem infusion, therefore amiodarone was started which resulted in rapid conversion to sinus rhythm. Patient was transitioned to amiodarone for the next 2 weeks. Antihypertensives were adjusted with the resumption of amlodipine and labetalol and increased dosing. Pneumonia appears resolved at this point the patient stable for discharge home. He will followup with cardiology in the outpatient setting.  Plan:  1. Atrial fibrillation with rapid ventricular response: Likely triggered by pneumonia and elevated blood pressure increasing left atrial pressures. Converted to sinus rhythm on amiodarone infusion. Cardiology consultation appreciated. Anticoagulation not indicated based on CHADS-Vasc score of 1. Continue amiodarone by mouth for the next 2 weeks and then stop. 2. Hypertensive urgency: Asymptomatic, resolved. Continue antihypertensives. 3. End-stage renal disease: Routine hemodialysis. 4. Recent diagnosis of pneumonia: Appears clinically resolved. Finish Ceftin. 5. Thrombocytopenia: Stable. Suspect related to recent infection. 6. Normocytic anemia: Likely anemia of chronic disease secondary to renal disease. Stable. Lifelong anticoagulation not recommended at this time per cardiology.  Consultants:  Cardiology Procedures:  Antibiotics:  Ceftin  Discharge Instructions  Discharge Orders   Future Orders Complete By Expires   Activity as tolerated - No restrictions  As directed    Diet - low sodium heart healthy  As directed    Discharge instructions  As directed    Comments:     Call your physician or seek immediate medical assistance for shortness of breath, rapid heart rate or worsening of condition.       Medication List    STOP taking these medications       diltiazem 300 MG 24 hr capsule  Commonly known as:  CARDIZEM CD      TAKE these medications       acetaminophen 500  MG tablet  Commonly known as:  TYLENOL  Take 1 tablet (500 mg total) by  mouth every 6 (six) hours as needed for mild pain.     amiodarone 200 MG tablet  Commonly known as:  PACERONE  Take 1 tablet (200 mg total) by mouth daily.     amLODipine 10 MG tablet  Commonly known as:  NORVASC  Take 1 tablet (10 mg total) by mouth daily.     aspirin 325 MG tablet  Take 1 tablet (325 mg total) by mouth daily.     benzonatate 100 MG capsule  Commonly known as:  TESSALON  Take 1 capsule (100 mg total) by mouth 3 (three) times daily as needed for cough.     cefUROXime 500 MG tablet  Commonly known as:  CEFTIN  Take 1 tablet (500 mg total) by mouth 2 (two) times daily with a meal.     diphenhydrAMINE 25 mg capsule  Commonly known as:  BENADRYL  Take 25 mg by mouth every 6 (six) hours as needed for allergies.     Fish Oil 1000 MG Caps  Take 2,000 mg by mouth 2 (two) times daily.     hydrALAZINE 50 MG tablet  Commonly known as:  APRESOLINE  Take 50 mg by mouth 2 (two) times daily.     labetalol 300 MG tablet  Commonly known as:  NORMODYNE  Take 2 tablets (600 mg total) by mouth 2 (two) times daily.     lanthanum 500 MG chewable tablet  Commonly known as:  FOSRENOL  Chew 1,000 mg by mouth as needed. Take 2 tabs w/ every meal & 1 w/ every snack.     lisinopril 40 MG tablet  Commonly known as:  PRINIVIL,ZESTRIL  Take 40 mg by mouth daily.     pravastatin 40 MG tablet  Commonly known as:  PRAVACHOL  Take 40 mg by mouth daily.     sodium bicarbonate 650 MG tablet  Take 650 mg by mouth 2 (two) times daily.       Allergies  Allergen Reactions  . Bystolic [Nebivolol Hcl] Palpitations    The results of significant diagnostics from this hospitalization (including imaging, microbiology, ancillary and laboratory) are listed below for reference.    Significant Diagnostic Studies: Dg Chest Portable 1 View  03/31/2013   CLINICAL DATA:  Tachycardia and increased blood pressure tonight. Hospitalized since Friday for pneumonia and discharge today.  EXAM:  PORTABLE CHEST - 1 VIEW  COMPARISON:  03/29/2013  FINDINGS: Cardiac enlargement with prominent pulmonary vascularity suggesting mild congestion. Infiltration in the left lung base appears stable since previous study and is consistent with pneumonia. No blunting of costophrenic angles. No pneumothorax.  IMPRESSION: Cardiac enlargement with mild pulmonary vascular congestion. No edema. Left lower lung infiltration consistent with pneumonia appear stable.   Electronically Signed   By: Burman Nieves M.D.   On: 03/31/2013 22:44    Microbiology: Recent Results (from the past 240 hour(s))  CULTURE, BLOOD (ROUTINE X 2)     Status: None   Collection Time    03/27/13 12:31 PM      Result Value Range Status   Specimen Description BLOOD LEFT HAND   Final   Special Requests BOTTLES DRAWN AEROBIC AND ANAEROBIC 8CC   Final   Culture NO GROWTH 5 DAYS   Final   Report Status 04/01/2013 FINAL   Final  CULTURE, BLOOD (ROUTINE X 2)     Status: None   Collection Time  03/27/13 12:31 PM      Result Value Range Status   Specimen Description BLOOD LEFT ANTECUBITAL   Final   Special Requests BOTTLES DRAWN AEROBIC AND ANAEROBIC 15CC   Final   Culture NO GROWTH 5 DAYS   Final   Report Status 04/01/2013 FINAL   Final  MRSA PCR SCREENING     Status: None   Collection Time    03/29/13  6:21 PM      Result Value Range Status   MRSA by PCR NEGATIVE  NEGATIVE Final   Comment:            The GeneXpert MRSA Assay (FDA     approved for NASAL specimens     only), is one component of a     comprehensive MRSA colonization     surveillance program. It is not     intended to diagnose MRSA     infection nor to guide or     monitor treatment for     MRSA infections.     Labs: Basic Metabolic Panel:  Recent Labs Lab 03/28/13 0632  03/31/13 0459 03/31/13 2203 04/01/13 0516 04/02/13 0432 04/03/13 0529  NA 137  < > 141 141 140 139 140  K 4.8  < > 3.9 3.9 4.2 3.6 3.8  CL 92*  < > 100 98 98 97 98  CO2 29  <  > 29 27 25 31 29   GLUCOSE 125*  < > 109* 120* 116* 176* 134*  BUN 34*  < > 28* 32* 35* 18 28*  CREATININE 8.79*  < > 8.15* 9.84* 10.46* 6.36* 9.22*  CALCIUM 9.6  < > 9.0 9.4 9.4 9.4 9.3  PHOS 4.3  --   --   --   --  2.8 4.1  < > = values in this interval not displayed. Liver Function Tests:  Recent Labs Lab 03/28/13 1610 03/31/13 2203 04/01/13 0516  AST 18 26 26   ALT 18 22 22   ALKPHOS 76 66 66  BILITOT 0.4 0.3 0.3  PROT 6.9 7.1 6.8  ALBUMIN 3.2* 3.3* 3.0*   CBC:  Recent Labs Lab 03/31/13 0459 03/31/13 2203 04/01/13 0516 04/02/13 0432 04/03/13 0529  WBC 4.5 4.2 5.2 5.0 4.4  NEUTROABS  --  3.0  --   --   --   HGB 9.5* 10.4* 10.0* 8.7* 8.3*  HCT 28.4* 31.0* 30.0* 25.9* 24.2*  MCV 90.7 89.6 90.1 89.6 90.0  PLT 112* 131* 134* 147* 135*   Cardiac Enzymes:  Recent Labs Lab 03/29/13 1744 03/29/13 2345 03/30/13 0553 03/31/13 2203  TROPONINI <0.30 <0.30 <0.30 <0.30    Active Problems:   HTN (hypertension)   HCAP (healthcare-associated pneumonia)   Tachycardia   Atrial fibrillation with RVR   Time coordinating discharge:  25 minutes  Signed:  Brendia Sacks, MD [facility] 04/03/2013, 2:31 PM

## 2013-04-03 NOTE — Progress Notes (Signed)
Subjective: Interval History: has no complaint of headache. He denies any difficulty in breathing. His appetite is good.  Objective: Vital signs in last 24 hours: Temp:  [97.9 F (36.6 C)-98.2 F (36.8 C)] 97.9 F (36.6 C) (11/28 0601) Pulse Rate:  [66-76] 74 (11/28 0601) Resp:  [13-25] 20 (11/28 0601) BP: (92-185)/(56-82) 181/82 mmHg (11/28 0601) SpO2:  [99 %] 99 % (11/28 0601) Weight:  [98.431 kg (217 lb)] 98.431 kg (217 lb) (11/28 0601) Weight change: -2.47 kg (-5 lb 7.1 oz)  Intake/Output from previous day: 11/27 0701 - 11/28 0700 In: 753.4 [P.O.:720; I.V.:33.4] Out: -  Intake/Output this shift:    General appearance: alert, cooperative and no distress Resp: clear to auscultation bilaterally Cardio: regular rate and rhythm, S1, S2 normal, no murmur, click, rub or gallop GI: soft, non-tender; bowel sounds normal; no masses,  no organomegaly Extremities: extremities normal, atraumatic, no cyanosis or edema  Lab Results:  Recent Labs  04/02/13 0432 04/03/13 0529  WBC 5.0 4.4  HGB 8.7* 8.3*  HCT 25.9* 24.2*  PLT 147* 135*   BMET:   Recent Labs  04/02/13 0432 04/03/13 0529  NA 139 140  K 3.6 3.8  CL 97 98  CO2 31 29  GLUCOSE 176* 134*  BUN 18 28*  CREATININE 6.36* 9.22*  CALCIUM 9.4 9.3   No results found for this basename: PTH,  in the last 72 hours Iron Studies: No results found for this basename: IRON, TIBC, TRANSFERRIN, FERRITIN,  in the last 72 hours  Studies/Results: No results found.  I have reviewed the patient's current medications.  Assessment/Plan: Problem #1 end-stage renal disease is status post hemodialysis the day before yesterday his BUN and creatinine is was in acceptable range. His normal potassium and patient does not any nausea vomiting. Problem #2 after fibrillation presently patient is in the sinuses rythm . Patient on labetalol  And his heart rate is controlled.  Problem #3 pneumonia patient has been on antibiotics and presently  feels better. Problem #4 metabolic bone disease his calcium and phosphorus is reasonably controlled Problem #5 anemia his hemoglobin and hematocrit has declined. Patient presently on Epogen. Problem #6 history of diabetes Problem #7 history of diabetic retinopathy. Plan: We'll check his basic metabolic panel and CBC in the morning.           Will start patient on amlodipine 10 mg by mouth daily.           We'll make arrangements for patient to get dialysis tomorrow if his on going to be discharged. If               patient goes home he will go to dialysis for his regular schedule.    LOS: 3 days   Saretta Dahlem S 04/03/2013,8:46 AM

## 2013-04-03 NOTE — Progress Notes (Signed)
SUBJECTIVE: The patient feels well today, denying chest pain and palpitations. He's concerned he has his medications at the time of discharge so that he doesn't run into the problem he had when he was previously discharged. He feels his "breathing is 90% better".     Intake/Output Summary (Last 24 hours) at 04/03/13 1058 Last data filed at 04/03/13 0700  Gross per 24 hour  Intake    480 ml  Output      0 ml  Net    480 ml    Current Facility-Administered Medications  Medication Dose Route Frequency Provider Last Rate Last Dose  . 0.9 %  sodium chloride infusion  250 mL Intravenous PRN Meredeth Ide, MD      . amiodarone (PACERONE) tablet 200 mg  200 mg Oral Daily Standley Brooking, MD   200 mg at 04/02/13 1008  . amLODipine (NORVASC) tablet 10 mg  10 mg Oral Daily Jamse Mead, MD   10 mg at 04/03/13 1021  . aspirin tablet 325 mg  325 mg Oral Daily Meredeth Ide, MD   325 mg at 04/03/13 1021  . atorvastatin (LIPITOR) tablet 10 mg  10 mg Oral q1800 Standley Brooking, MD   10 mg at 04/02/13 1726  . benzonatate (TESSALON) capsule 100 mg  100 mg Oral TID PRN Meredeth Ide, MD   100 mg at 04/02/13 0943  . cefUROXime (CEFTIN) tablet 500 mg  500 mg Oral BID WC Meredeth Ide, MD   500 mg at 04/03/13 1021  . diphenhydrAMINE (BENADRYL) capsule 25 mg  25 mg Oral Q6H PRN Meredeth Ide, MD      . epoetin alfa (EPOGEN,PROCRIT) injection 4,000 Units  4,000 Units Intravenous 3 times weekly Randa Lynn, MD   4,000 Units at 04/01/13 2142  . heparin injection 5,000 Units  5,000 Units Subcutaneous Q8H Meredeth Ide, MD   5,000 Units at 04/03/13 0606  . hydrALAZINE (APRESOLINE) tablet 50 mg  50 mg Oral BID Meredeth Ide, MD   50 mg at 04/03/13 1021  . labetalol (NORMODYNE) tablet 600 mg  600 mg Oral BID Jamse Mead, MD   600 mg at 04/03/13 1020  . lisinopril (PRINIVIL,ZESTRIL) tablet 40 mg  40 mg Oral Daily Meredeth Ide, MD   40 mg at 04/03/13 1048  . sodium bicarbonate tablet 650  mg  650 mg Oral BID Meredeth Ide, MD   650 mg at 04/03/13 1020  . sodium chloride 0.9 % injection 3 mL  3 mL Intravenous Q12H Meredeth Ide, MD   3 mL at 04/03/13 1049  . sodium chloride 0.9 % injection 3 mL  3 mL Intravenous PRN Meredeth Ide, MD        Filed Vitals:   04/02/13 2155 04/02/13 2156 04/03/13 0601 04/03/13 1039  BP: 185/71  181/82 183/86  Pulse:  76 74 70  Temp:   97.9 F (36.6 C) 97.5 F (36.4 C)  TempSrc:   Oral Oral  Resp:   20 20  Height:      Weight:   217 lb (98.431 kg)   SpO2:   99% 98%    PHYSICAL EXAM General: NAD Neck: No JVD, no thyromegaly or thyroid nodule.  Lungs: Crackles at left base with normal respiratory effort. CV: Nondisplaced PMI.  Regular rhythm, normal S1/S2, no S3/S4, no murmur.  No pretibial edema.  No carotid bruit.  Normal pedal  pulses.  Abdomen: Soft, nontender, no hepatosplenomegaly, no distention.  Neurologic: Alert and oriented x 3.  Psych: Normal affect. Extremities: No clubbing or cyanosis.   TELEMETRY: Reviewed telemetry pt in sinus rhythm, 70 bpm range.  LABS: Basic Metabolic Panel:  Recent Labs  16/10/96 0432 04/03/13 0529  NA 139 140  K 3.6 3.8  CL 97 98  CO2 31 29  GLUCOSE 176* 134*  BUN 18 28*  CREATININE 6.36* 9.22*  CALCIUM 9.4 9.3  PHOS 2.8 4.1   Liver Function Tests:  Recent Labs  03/31/13 2203 04/01/13 0516  AST 26 26  ALT 22 22  ALKPHOS 66 66  BILITOT 0.3 0.3  PROT 7.1 6.8  ALBUMIN 3.3* 3.0*   No results found for this basename: LIPASE, AMYLASE,  in the last 72 hours CBC:  Recent Labs  03/31/13 2203  04/02/13 0432 04/03/13 0529  WBC 4.2  < > 5.0 4.4  NEUTROABS 3.0  --   --   --   HGB 10.4*  < > 8.7* 8.3*  HCT 31.0*  < > 25.9* 24.2*  MCV 89.6  < > 89.6 90.0  PLT 131*  < > 147* 135*  < > = values in this interval not displayed. Cardiac Enzymes:  Recent Labs  03/31/13 2203  TROPONINI <0.30   BNP: No components found with this basename: POCBNP,  D-Dimer: No results found for  this basename: DDIMER,  in the last 72 hours Hemoglobin A1C: No results found for this basename: HGBA1C,  in the last 72 hours Fasting Lipid Panel: No results found for this basename: CHOL, HDL, LDLCALC, TRIG, CHOLHDL, LDLDIRECT,  in the last 72 hours Thyroid Function Tests: No results found for this basename: TSH, T4TOTAL, FREET3, T3FREE, THYROIDAB,  in the last 72 hours Anemia Panel: No results found for this basename: VITAMINB12, FOLATE, FERRITIN, TIBC, IRON, RETICCTPCT,  in the last 72 hours  RADIOLOGY: Dg Chest 2 View  03/29/2013   CLINICAL DATA:  Pneumonia.  EXAM: CHEST  2 VIEW  COMPARISON:  March 27, 2013.  FINDINGS: Stable cardiomegaly. Right lung is clear. Left lower lobe airspace opacity is slightly improved consistent with pneumonia. No pneumothorax or pleural effusion is noted. Bony thorax is intact.  IMPRESSION: Slightly improved left lower lobe pneumonia.   Electronically Signed   By: Roque Lias M.D.   On: 03/29/2013 10:45   Dg Chest 2 View  03/27/2013   CLINICAL DATA:  Cough, shortness of breath  EXAM: CHEST  2 VIEW  COMPARISON:  03/04/2007  FINDINGS: Enlargement of cardiac silhouette.  Mediastinal contours normal.  Mild pulmonary vascular congestion.  Extensive airspace infiltrate identified in both the left upper and left lower lobes most consistent with pneumonia.  Central peribronchial thickening noted.  Right lung grossly clear.  Question component of pleural effusion at lateral left lung base.  No pneumothorax or acute osseous findings.  IMPRESSION: Enlargement of cardiac silhouette.  Extensive infiltrate in left upper and left lower lobes most consistent with pneumonia.  Question small left pleural effusion.   Electronically Signed   By: Ulyses Southward M.D.   On: 03/27/2013 10:52   Dg Chest Portable 1 View  03/31/2013   CLINICAL DATA:  Tachycardia and increased blood pressure tonight. Hospitalized since Friday for pneumonia and discharge today.  EXAM: PORTABLE CHEST - 1  VIEW  COMPARISON:  03/29/2013  FINDINGS: Cardiac enlargement with prominent pulmonary vascularity suggesting mild congestion. Infiltration in the left lung base appears stable since previous study and is  consistent with pneumonia. No blunting of costophrenic angles. No pneumothorax.  IMPRESSION: Cardiac enlargement with mild pulmonary vascular congestion. No edema. Left lower lung infiltration consistent with pneumonia appear stable.   Electronically Signed   By: Burman Nieves M.D.   On: 03/31/2013 22:44      ASSESSMENT AND PLAN: 1. Atrial fibrillation: he cardioverted with the amiodarone infusion. This may have been triggered by both resolving HCAP and markedly elevated BP, leading to increased left atrial pressures. He is now on amiodarone 200 mg daily. I would recommend continuing this for the next 14 days and then stopping it, allowing him to recover completely from his pneumonia, which is likely a precipitating factor. Given his age and with a CHADS-Vasc score of 1 (HTN), I do not feel he should be committed to lifelong anticoagulation at this time. This can be reevaluated in the future if he were to have florid diastolic heart failure.  2. HTN: uncontrolled, for which amlodipine 10 mg was restarted (had been taking this previously as outpatient). Labetalol increased to 600 mg bid as well. Continue hydralazine and lisinopril for now.  3. HCAP: on Ceftin, and appears to have resolved to a significant degree.  4. ESRD: on dialysis.  Dispo: I will arrange for outpt f/u in our office.  Prentice Docker, M.D., F.A.C.C.

## 2013-04-03 NOTE — Progress Notes (Signed)
[facility] PROGRESS NOTE  Justin Ortega XBJ:[id number] DOB: 12-05-1980 DOA: 03/31/2013 PCP: Josue Hector, MD  Assessment/Plan: 1. Atrial fibrillation with rapid ventricular response: Likely triggered by pneumonia and elevated blood pressure increasing left atrial pressures. Converted to sinus rhythm on amiodarone infusion. Cardiology consultation appreciated. Anticoagulation not indicated based on CHADS-Vasc score of 1.  2. Hypertensive urgency: Asymptomatic, resolved. Continue antihypertensives. 3. End-stage renal disease: Routine hemodialysis. 4. Recent diagnosis of pneumonia: Appears clinically resolved. Finish Ceftin. 5. Thrombocytopenia: Stable. Suspect related to recent infection. 6. Normocytic anemia: Likely anemia of chronic disease secondary to renal disease. Stable.   Wean oxygen  Continue amiodarone 200 mg daily for the next 14 days and then stop. Lifelong anticoagulation not recommended at this time.  Discharge home   Brendia Sacks, MD  [facility]  Pager [phone] If 7PM-7AM, please contact night-coverage at [email or web address], password [facility] 04/03/2013, 1:48 PM  LOS: 3 days   Summary: 32 year old male with history of end-stage renal disease on hemodialysis recently discharged 11/25, treated for pneumonia, hypoxic respiratory failure as well as new onset atrial fibrillation who presents to the hospital several hours after discharge with atrial fibrillation with rapid ventricular response. He was admitted for A. fib with RVR in cardiology consultation and treatment of blood pressure.  Consultants:  Cardiology  Procedures:    Antibiotics:  Ceftin  HPI/Subjective: Feels good. No complaints. Breathing better. Ready to go home.  Objective: Filed Vitals:   04/02/13 2156 04/03/13 0601 04/03/13 1039 04/03/13 1335  BP:  181/82 183/86 163/75  Pulse: 76 74 70 73  Temp:  97.9 F (36.6 C) 97.5 F (36.4 C) 97.7 F (36.5 C)  TempSrc:  Oral Oral  Oral  Resp:  20 20 20   Height:      Weight:  98.431 kg (217 lb)    SpO2:  99% 98% 98%    Intake/Output Summary (Last 24 hours) at 04/03/13 1348 Last data filed at 04/03/13 1300  Gross per 24 hour  Intake    240 ml  Output      0 ml  Net    240 ml     Filed Weights   04/01/13 0400 04/01/13 1940 04/03/13 0601  Weight: 99.6 kg (219 lb 9.3 oz) 100.9 kg (222 lb 7.1 oz) 98.431 kg (217 lb)    Exam:   Afebrile. Vitals stable.  Telemetry: Remains in sinus rhythm  General: Appears calm and comfortable. Speech fluent and clear.  Cardiovascular: Regular rate and rhythm. No murmur, rub or gallop.  Respiratory: Clear to auscultation bilaterally. No wheezes, rales rhonchi. Normal respiratory effort.  Data Reviewed:  Basic metabolic panel consistent with end-stage renal disease   Hemoglobin stable 8.3  Platelet count stable 135  Scheduled Meds: . amiodarone  200 mg Oral Daily  . amLODipine  10 mg Oral Daily  . aspirin  325 mg Oral Daily  . atorvastatin  10 mg Oral q1800  . cefUROXime  500 mg Oral BID WC  . epoetin alfa  4,000 Units Intravenous 3 times weekly  . heparin  5,000 Units Subcutaneous Q8H  . hydrALAZINE  50 mg Oral BID  . labetalol  600 mg Oral BID  . lisinopril  40 mg Oral Daily  . sodium bicarbonate  650 mg Oral BID  . sodium chloride  3 mL Intravenous Q12H   Continuous Infusions:    Active Problems:   HTN (hypertension)   HCAP (healthcare-associated pneumonia)   Tachycardia   Atrial fibrillation with RVR

## 2013-04-16 ENCOUNTER — Emergency Department (HOSPITAL_COMMUNITY)
Admission: EM | Admit: 2013-04-16 | Discharge: 2013-04-16 | Disposition: A | Discharge status: Home / Self Care | Payer: Medicare Other | Attending: Emergency Medicine | Admitting: Emergency Medicine

## 2013-04-16 ENCOUNTER — Emergency Department (HOSPITAL_COMMUNITY): Payer: Medicare Other

## 2013-04-16 ENCOUNTER — Encounter (HOSPITAL_COMMUNITY): Payer: Self-pay | Admitting: Emergency Medicine

## 2013-04-16 DIAGNOSIS — Z7982 Long term (current) use of aspirin: Secondary | ICD-10-CM | POA: Insufficient documentation

## 2013-04-16 DIAGNOSIS — N186 End stage renal disease: Secondary | ICD-10-CM | POA: Insufficient documentation

## 2013-04-16 DIAGNOSIS — R002 Palpitations: Secondary | ICD-10-CM

## 2013-04-16 DIAGNOSIS — F17200 Nicotine dependence, unspecified, uncomplicated: Secondary | ICD-10-CM | POA: Insufficient documentation

## 2013-04-16 DIAGNOSIS — R011 Cardiac murmur, unspecified: Secondary | ICD-10-CM | POA: Insufficient documentation

## 2013-04-16 DIAGNOSIS — Z992 Dependence on renal dialysis: Secondary | ICD-10-CM | POA: Insufficient documentation

## 2013-04-16 DIAGNOSIS — Z79899 Other long term (current) drug therapy: Secondary | ICD-10-CM | POA: Insufficient documentation

## 2013-04-16 DIAGNOSIS — I4891 Unspecified atrial fibrillation: Secondary | ICD-10-CM | POA: Insufficient documentation

## 2013-04-16 DIAGNOSIS — H5710 Ocular pain, unspecified eye: Secondary | ICD-10-CM | POA: Insufficient documentation

## 2013-04-16 DIAGNOSIS — I480 Paroxysmal atrial fibrillation: Secondary | ICD-10-CM

## 2013-04-16 DIAGNOSIS — I120 Hypertensive chronic kidney disease with stage 5 chronic kidney disease or end stage renal disease: Secondary | ICD-10-CM | POA: Insufficient documentation

## 2013-04-16 DIAGNOSIS — Z8669 Personal history of other diseases of the nervous system and sense organs: Secondary | ICD-10-CM | POA: Insufficient documentation

## 2013-04-16 LAB — CBC WITH DIFFERENTIAL/PLATELET
Basophils Relative: 1 % (ref 0–1)
Eosinophils Absolute: 0.2 10*3/uL (ref 0.0–0.7)
Eosinophils Relative: 3 % (ref 0–5)
HCT: 27.1 % — ABNORMAL LOW (ref 39.0–52.0)
Hemoglobin: 9.1 g/dL — ABNORMAL LOW (ref 13.0–17.0)
Lymphocytes Relative: 15 % (ref 12–46)
Lymphs Abs: 0.8 10*3/uL (ref 0.7–4.0)
MCH: 30.2 pg (ref 26.0–34.0)
MCHC: 33.6 g/dL (ref 30.0–36.0)
MCV: 90 fL (ref 78.0–100.0)
Monocytes Absolute: 0.4 10*3/uL (ref 0.1–1.0)
Monocytes Relative: 7 % (ref 3–12)
Neutrophils Relative %: 73 % (ref 43–77)
RBC: 3.01 MIL/uL — ABNORMAL LOW (ref 4.22–5.81)

## 2013-04-16 LAB — BASIC METABOLIC PANEL
BUN: 29 mg/dL — ABNORMAL HIGH (ref 6–23)
Chloride: 95 mEq/L — ABNORMAL LOW (ref 96–112)
Creatinine, Ser: 5.99 mg/dL — ABNORMAL HIGH (ref 0.50–1.35)
GFR calc Af Amer: 13 mL/min — ABNORMAL LOW (ref >90)
Glucose, Bld: 142 mg/dL — ABNORMAL HIGH (ref 70–99)
Potassium: 3.7 mEq/L (ref 3.5–5.1)

## 2013-04-16 NOTE — ED Provider Notes (Signed)
CSN: [id number]     Arrival date & time 04/16/13  1113 History  This chart was scribed for Roney Marion, MD,  by Ashley Jacobs, ED Scribe. The patient was seen in room APA02/APA02 and the patient's care was started at 11:40.   First MD Initiated Contact with Patient 04/16/13 1119     Chief Complaint  Patient presents with  . irregular heartbeat    (Consider location/radiation/quality/duration/timing/severity/associated sxs/prior Treatment) The history is provided by the patient and medical records. No language interpreter was used.   HPI Comments: Justin Ortega is a 32 y.o. male who presents to the Emergency Department complaining of intermittent irregular heartbeat after dialysis and while in the car on the way to the ED. Pt finished his dialysis 50 minutes earlier than expected today. He was recently admitted for PNA and a-Fib 11/21 for one week PTA and treated for pneumonia. Pt denies SOB, chest pain, cough and rhinnorhea. He also denies nausea and vomiting. He is taking Asprin 325 everyday. His parents had renal complications. Pt had left ear "fogged" and was treated for fluid behind his ear. He states after treatment his symptoms did not resolve.  Past Medical History  Diagnosis Date  . Renal disorder   . Blind in both eyes   . Hypertension    Past Surgical History  Procedure Laterality Date  . Eye surgery    . Dialysis fistula creation     Family History  Problem Relation Age of Onset  . Diabetes Mother   . Hypertension Mother   . Diabetes Father   . Hypertension Father   . Heart disease Mother     Died ([facility]) on dialysis  . Heart disease Father   . Kidney failure Father   . Kidney failure Mother    History  Substance Use Topics  . Smoking status: Current Every Day Smoker -- 0.25 packs/day    Types: Cigarettes  . Smokeless tobacco: Not on file  . Alcohol Use: No    Review of Systems  HENT: Negative for rhinorrhea.   Eyes: Positive for pain.  Respiratory:  Negative for cough and shortness of breath.   Cardiovascular: Positive for palpitations. Negative for chest pain.  Gastrointestinal: Negative for nausea and vomiting.  All other systems reviewed and are negative.    Allergies  Bystolic  Home Medications   Current Outpatient Rx  Name  Route  Sig  Dispense  Refill  . amiodarone (PACERONE) 200 MG tablet   Oral   Take 1 tablet (200 mg total) by mouth daily.   14 tablet   0   . amLODipine (NORVASC) 10 MG tablet   Oral   Take 1 tablet (10 mg total) by mouth daily.   30 tablet   0   . aspirin 325 MG tablet   Oral   Take 1 tablet (325 mg total) by mouth daily.         . diphenhydrAMINE (BENADRYL) 25 mg capsule   Oral   Take 25 mg by mouth every 6 (six) hours as needed for allergies.         . hydrALAZINE (APRESOLINE) 50 MG tablet   Oral   Take 50 mg by mouth 2 (two) times daily.         Marland Kitchen labetalol (NORMODYNE) 300 MG tablet   Oral   Take 2 tablets (600 mg total) by mouth 2 (two) times daily.   120 tablet   0   . lanthanum (FOSRENOL) 500  MG chewable tablet   Oral   Chew 1,000 mg by mouth as needed. Take 2 tabs w/ every meal & 1 w/ every snack.         Marland Kitchen lisinopril (PRINIVIL,ZESTRIL) 40 MG tablet   Oral   Take 40 mg by mouth daily.         . multivitamin (RENA-VIT) TABS tablet   Oral   Take 1 tablet by mouth daily.         . Omega-3 Fatty Acids (FISH OIL) 1000 MG CAPS   Oral   Take 2,000 mg by mouth 2 (two) times daily.         . pravastatin (PRAVACHOL) 40 MG tablet   Oral   Take 40 mg by mouth daily.         . sodium bicarbonate 650 MG tablet   Oral   Take 650 mg by mouth 2 (two) times daily.         . Triamcinolone Acetonide (NASACORT ALLERGY 24HR NA)   Nasal   Place 2 sprays into the nose daily.         Marland Kitchen acetaminophen (TYLENOL) 500 MG tablet   Oral   Take 1 tablet (500 mg total) by mouth every 6 (six) hours as needed for mild pain.         . benzonatate (TESSALON) 100 MG  capsule   Oral   Take 1 capsule (100 mg total) by mouth 3 (three) times daily as needed for cough.   20 capsule   0    BP 163/81  Temp(Src) 97.9 F (36.6 C) (Oral)  Resp 20  Ht 5\' 8"  (1.727 m)  Wt 217 lb 9.5 oz (98.7 kg)  BMI 33.09 kg/m2  SpO2 99% Physical Exam  Nursing note and vitals reviewed. Constitutional: He is oriented to person, place, and time. He appears well-developed and well-nourished. No distress.  HENT:  Head: Normocephalic and atraumatic.  Mouth/Throat: Oropharynx is clear and moist.  Eyes: Conjunctivae are normal. Pupils are equal, round, and reactive to light. No scleral icterus.  Neck: Neck supple.  Cardiovascular: Normal rate, regular rhythm and intact distal pulses.   Murmur heard. 2/6 systolic murmur  Pulmonary/Chest: Effort normal and breath sounds normal. No stridor. No respiratory distress. He has no wheezes. He has no rales.  Abdominal: Soft. He exhibits no distension. There is no tenderness.  Musculoskeletal: Normal range of motion. He exhibits no edema.  Neurological: He is alert and oriented to person, place, and time.  Skin: Skin is warm and dry. No rash noted.  Psychiatric: He has a normal mood and affect. His behavior is normal.    ED Course  Procedures (including critical care time) DIAGNOSTIC STUDIES: Oxygen Saturation is 99% on room air, normal by my interpretation.    COORDINATION OF CARE: 11:45 PM Discussed course of care with pt . Pt understands and agrees.  Labs Review Labs Reviewed  CBC WITH DIFFERENTIAL - Abnormal; Notable for the following:    RBC 3.01 (*)    Hemoglobin 9.1 (*)    HCT 27.1 (*)    Platelets 143 (*)    All other components within normal limits  BASIC METABOLIC PANEL - Abnormal; Notable for the following:    Chloride 95 (*)    Glucose, Bld 142 (*)    BUN 29 (*)    Creatinine, Ser 5.99 (*)    GFR calc non Af Amer 11 (*)    GFR calc Af Amer 13 (*)  All other components within normal limits  MAGNESIUM   TROPONIN I   Imaging Review Dg Chest 2 View  04/16/2013   CLINICAL DATA:  Atrial fibrillation.  Pneumonia.  EXAM: CHEST  2 VIEW  COMPARISON:  03/31/2013  FINDINGS: Moderate cardiomegaly and pulmonary vascular congestion again noted. No evidence of acute infiltrate or pulmonary edema. No evidence of pleural effusion. No mass or lymphadenopathy identified.  IMPRESSION: Stable cardiomegaly and chronic pulmonary venous hypertension. No acute findings.   Electronically Signed   By: Myles Rosenthal M.D.   On: 04/16/2013 12:20    EKG Interpretation   None       MDM   1. Palpitations   2. Paroxysmal atrial fibrillation    Electrolytes showed no gross abnormalities considering that he just finished his dialysis. His regular care. He'll continue his amiodarone. No need for medication changes, unless he has recurrence.  ,I personally performed the services described in this documentation, which was scribed in my presence. The recorded information has been reviewed and is accurate.   Roney Marion, MD 04/16/13 [phone]

## 2013-04-16 NOTE — ED Notes (Signed)
Recently admitted for PNA and a-fib, placed on amiodarone.  Reports was at dialysis today when felt heart fluttering.  Denies cp/sob/dizziness.

## 2013-04-18 ENCOUNTER — Emergency Department (HOSPITAL_COMMUNITY): Payer: Medicare Other

## 2013-04-18 ENCOUNTER — Emergency Department (HOSPITAL_COMMUNITY)
Admission: EM | Admit: 2013-04-18 | Discharge: 2013-04-18 | Disposition: A | Discharge status: Home / Self Care | Payer: Medicare Other | Attending: Emergency Medicine | Admitting: Emergency Medicine

## 2013-04-18 ENCOUNTER — Encounter (HOSPITAL_COMMUNITY): Payer: Self-pay | Admitting: Emergency Medicine

## 2013-04-18 DIAGNOSIS — I480 Paroxysmal atrial fibrillation: Secondary | ICD-10-CM

## 2013-04-18 DIAGNOSIS — F17200 Nicotine dependence, unspecified, uncomplicated: Secondary | ICD-10-CM | POA: Insufficient documentation

## 2013-04-18 DIAGNOSIS — Z7982 Long term (current) use of aspirin: Secondary | ICD-10-CM | POA: Insufficient documentation

## 2013-04-18 DIAGNOSIS — R002 Palpitations: Secondary | ICD-10-CM | POA: Insufficient documentation

## 2013-04-18 DIAGNOSIS — R011 Cardiac murmur, unspecified: Secondary | ICD-10-CM | POA: Insufficient documentation

## 2013-04-18 DIAGNOSIS — I10 Essential (primary) hypertension: Secondary | ICD-10-CM | POA: Insufficient documentation

## 2013-04-18 DIAGNOSIS — Z87448 Personal history of other diseases of urinary system: Secondary | ICD-10-CM | POA: Insufficient documentation

## 2013-04-18 DIAGNOSIS — I4891 Unspecified atrial fibrillation: Secondary | ICD-10-CM | POA: Insufficient documentation

## 2013-04-18 DIAGNOSIS — Z79899 Other long term (current) drug therapy: Secondary | ICD-10-CM | POA: Insufficient documentation

## 2013-04-18 DIAGNOSIS — Z8669 Personal history of other diseases of the nervous system and sense organs: Secondary | ICD-10-CM | POA: Insufficient documentation

## 2013-04-18 HISTORY — DX: Unspecified atrial fibrillation: I48.91

## 2013-04-18 LAB — COMPREHENSIVE METABOLIC PANEL
ALT: 18 U/L (ref 0–53)
Albumin: 3.6 g/dL (ref 3.5–5.2)
BUN: 21 mg/dL (ref 6–23)
Calcium: 9.5 mg/dL (ref 8.4–10.5)
Creatinine, Ser: 5.59 mg/dL — ABNORMAL HIGH (ref 0.50–1.35)
GFR calc Af Amer: 14 mL/min — ABNORMAL LOW (ref >90)
Glucose, Bld: 143 mg/dL — ABNORMAL HIGH (ref 70–99)
Sodium: 139 mEq/L (ref 135–145)
Total Protein: 6.8 g/dL (ref 6.0–8.3)

## 2013-04-18 LAB — CBC WITH DIFFERENTIAL/PLATELET
Eosinophils Absolute: 0.2 10*3/uL (ref 0.0–0.7)
Eosinophils Relative: 4 % (ref 0–5)
Hemoglobin: 9.4 g/dL — ABNORMAL LOW (ref 13.0–17.0)
Lymphs Abs: 0.8 10*3/uL (ref 0.7–4.0)
MCH: 30 pg (ref 26.0–34.0)
MCV: 91.1 fL (ref 78.0–100.0)
Monocytes Relative: 7 % (ref 3–12)
Platelets: 166 10*3/uL (ref 150–400)
RBC: 3.13 MIL/uL — ABNORMAL LOW (ref 4.22–5.81)
RDW: 14.4 % (ref 11.5–15.5)

## 2013-04-18 LAB — TROPONIN I: Troponin I: <0.3 ng/mL (ref <0.30)

## 2013-04-18 LAB — MAGNESIUM: Magnesium: 2 mg/dL (ref 1.5–2.5)

## 2013-04-18 MED ORDER — DILTIAZEM HCL ER COATED BEADS 120 MG PO CP24: 120.0000 mg | ORAL_CAPSULE | Freq: Every day | ORAL | Status: DC

## 2013-04-18 NOTE — ED Notes (Signed)
Patient reports having hx of afib after having pnuemonia on 04/01/13 in which he takes amiodarone 200mg . Per patient  Having dialysis on Thursday and felt "heartbeat going in and out of rhythm" in which dialysis nurses listened to his heart and sent him to ER. Per patient arrythmia did not occur during ER visit but was told to come back if it happened again and amiodarone may need to be adjusted. Patient reports feeling his heart changing during dialysis again today.

## 2013-04-18 NOTE — ED Notes (Signed)
Pt c/o irregular heart beat that began while at dialysis this afternoon. Pt states he had similar symptoms during his dialysis tx on Thursday. Pt was diagnosed with afib 2-3 weeks ago and has since been taking amiodarone.

## 2013-04-18 NOTE — ED Provider Notes (Signed)
CSN: [id number]     Arrival date & time 04/18/13  1235 History  This chart was scribed for Glynn Octave, MD by Luisa Dago, ED Scribe and Bennett Scrape, ED Scribe. This patient was seen in room APA11/APA11 and the patient's care was started at 1:09 PM.    Chief Complaint  Patient presents with  . Irregular Heart Beat   The history is provided by the patient. No language interpreter was used.   HPI Comments: Justin Ortega is a 32 y.o. male who presents to the Emergency Department with a newly diagnosed A. Fib complaining of intermittent episodes of palpitations described as an irregular heart beat. Pt states that he was diagnosed with Afib after having pneumonia on 04/01/13. He was started on amiodarone which he takes 200 mg of daily. He denies any missed doses. He states that he had been doing well since discharge until he developed the palpitations during dialysis 2 days ago. His dialysis was stopped 3 hours in and he was sen to the ED. No A. Fib was noted during that visit and the pt states that the symptoms stopped after 10 to 15 minutes.  He denies any further episodes until today, felt again while at dialysis. He states that he finished his session and then came to the ED; however, the symptoms stopped after 10 to 15 minutes. He denies, chest pain, fever, and SOB. Pt states that he has a history of hypertension.      Past Medical History  Diagnosis Date  . Renal disorder   . Blind in both eyes   . Hypertension   . A-fib    Past Surgical History  Procedure Laterality Date  . Eye surgery    . Dialysis fistula creation     Family History  Problem Relation Age of Onset  . Diabetes Mother   . Hypertension Mother   . Diabetes Father   . Hypertension Father   . Heart disease Mother     Died ([facility]) on dialysis  . Heart disease Father   . Kidney failure Father   . Kidney failure Mother    History  Substance Use Topics  . Smoking status: Current Every Day Smoker -- 0.25  packs/day    Types: Cigarettes  . Smokeless tobacco: Not on file  . Alcohol Use: No    Review of Systems  Constitutional: Negative for fever and fatigue.  Respiratory: Negative for shortness of breath.   Cardiovascular: Positive for palpitations. Negative for chest pain.  All other systems reviewed and are negative.    Allergies  Bystolic  Home Medications   Current Outpatient Rx  Name  Route  Sig  Dispense  Refill  . amiodarone (PACERONE) 200 MG tablet   Oral   Take 1 tablet (200 mg total) by mouth daily.   14 tablet   0   . amLODipine (NORVASC) 10 MG tablet   Oral   Take 1 tablet (10 mg total) by mouth daily.   30 tablet   0   . aspirin 325 MG tablet   Oral   Take 1 tablet (325 mg total) by mouth daily.         . hydrALAZINE (APRESOLINE) 50 MG tablet   Oral   Take 50 mg by mouth 2 (two) times daily.         Marland Kitchen labetalol (NORMODYNE) 300 MG tablet   Oral   Take 2 tablets (600 mg total) by mouth 2 (two) times daily.  120 tablet   0   . lanthanum (FOSRENOL) 500 MG chewable tablet   Oral   Chew 1,000 mg by mouth as needed. Take 2 tabs w/ every meal & 1 w/ every snack.         Marland Kitchen lisinopril (PRINIVIL,ZESTRIL) 40 MG tablet   Oral   Take 40 mg by mouth daily.         . multivitamin (RENA-VIT) TABS tablet   Oral   Take 1 tablet by mouth daily.         . Omega-3 Fatty Acids (FISH OIL) 1000 MG CAPS   Oral   Take 2,000 mg by mouth 2 (two) times daily.         . pravastatin (PRAVACHOL) 40 MG tablet   Oral   Take 40 mg by mouth daily.         . sodium bicarbonate 650 MG tablet   Oral   Take 650 mg by mouth 2 (two) times daily.         . Triamcinolone Acetonide (NASACORT ALLERGY 24HR NA)   Nasal   Place 2 sprays into the nose daily.         Marland Kitchen diltiazem (CARDIZEM CD) 120 MG 24 hr capsule   Oral   Take 1 capsule (120 mg total) by mouth daily.   30 capsule   0    Triage vitals:BP 172/73  Pulse 71  Temp(Src) 98.1 F (36.7 C) (Oral)   Resp 20  Ht 5\' 8"  (1.727 m)  Wt 200 lb 15 oz (91.145 kg)  BMI 30.56 kg/m2  SpO2 98%  Physical Exam  Nursing note and vitals reviewed. Constitutional: He is oriented to person, place, and time. He appears well-developed and well-nourished. No distress.  HENT:  Head: Normocephalic and atraumatic.  Mouth/Throat: Oropharynx is clear and moist. No oropharyngeal exudate.  Eyes: Conjunctivae and EOM are normal.  Neck: Normal range of motion. Neck supple. No tracheal deviation present.  Cardiovascular: Normal rate and regular rhythm.   Murmur heard. Systolic murmur.  Pulmonary/Chest: Effort normal. No respiratory distress. He exhibits no tenderness.  Abdominal: Soft. There is no tenderness. There is no rebound and no guarding.  Musculoskeletal: Normal range of motion. He exhibits no edema and no tenderness.  LUE fistula with thrill and bruit.  Neurological: He is alert and oriented to person, place, and time. No cranial nerve deficit. He exhibits normal muscle tone. Coordination normal.  Skin: Skin is warm and dry.  Psychiatric: He has a normal mood and affect. His behavior is normal.    ED Course  Procedures (including critical care time)  DIAGNOSTIC STUDIES: Oxygen Saturation is 98% on room air, normal by my interpretation.    COORDINATION OF CARE: 1:12 PM-Discussed treatment plan which includes electrolyte, and calling the heart doctor to change his medication with pt at bedside and pt agreed to plan.   1:56 PM-Consult complete with Dr. Marcelyn Bruins. Patient case explained and discussed. Dr. Johney Frame advises pt to follow up with Konswornen, Cards, on Monday and to continue with amiodarone. Call ended at 1:58 PM.  2:34 PM-Consult complete with Dr. Johney Frame, Cards. He advises to start pt on Cardizem, 120 mg daily. Call ended at 2:36 PM  Labs Review Labs Reviewed  CBC WITH DIFFERENTIAL - Abnormal; Notable for the following:    RBC 3.13 (*)    Hemoglobin 9.4 (*)    HCT 28.5 (*)     All other components within normal limits  COMPREHENSIVE METABOLIC PANEL -  Abnormal; Notable for the following:    Glucose, Bld 143 (*)    Creatinine, Ser 5.59 (*)    GFR calc non Af Amer 12 (*)    GFR calc Af Amer 14 (*)    All other components within normal limits  TROPONIN I  MAGNESIUM   Imaging Review Dg Chest 2 View  04/18/2013   CLINICAL DATA:  Heart palpitations  EXAM: CHEST  2 VIEW  COMPARISON:  04/16/2013  FINDINGS: The cardiac shadow is again enlarged. The lungs are free of acute infiltrate. Small effusions are noted posteriorly. No acute bony abnormality is seen.  IMPRESSION: Small effusions without focal infiltrate.   Electronically Signed   By: Alcide Clever M.D.   On: 04/18/2013 14:17    EKG Interpretation    Date/Time:  Saturday April 18 2013 13:10:00 EST Ventricular Rate:  70 PR Interval:  174 QRS Duration: 106 QT Interval:  426 QTC Calculation: 460 R Axis:   90 Text Interpretation:  Normal sinus rhythm Rightward axis Nonspecific T wave abnormality Prolonged QT Abnormal ECG When compared with ECG of 16-Apr-2013 11:27, T wave inversion less evident in Lateral leads Artifact No significant change was found Confirmed by Manus Gunning  MD, Tivon Lemoine ([id number]) on 04/18/2013 1:25:52 PM            MDM   1. Palpitations   2. Paroxysmal atrial fibrillation    Palpitations during dialysis it lasted for 10 minutes, now resolved. No chest pain or shortness of breath. Recent admission for pneumonia and atrial fibrillation. On amiodarone.  Regular rate and rhythm on exam. Sinus rhythm on EKG. Stable nonspecific ST changes. Patient is asymptomatic at this time.  Electrolytes within acceptable limits after dialysis. Patient asymptomatic in the ED. Regular rate and rhythm with normal sinus rhythm on monitor. Suspect he is having paroxysmal atrial fibrillation during dialysis. This is discussed with on-call cardiologist Dr. Johney Frame. We will adjust his medications and add Cardizem  120 mg daily. He'll need to followup with his cardiologist this week. Continue labetalol 600 twice a day.  I personally performed the services described in this documentation, which was scribed in my presence. The recorded information has been reviewed and is accurate.    Glynn Octave, MD 04/18/13 1455

## 2013-04-20 ENCOUNTER — Telehealth: Payer: Self-pay

## 2013-04-20 NOTE — Telephone Encounter (Signed)
Pt states he went to the hospital for an infection which made his heart go into A-fib. Which was fixed in the hospital. Pt is on dyalisis and states that he feels his heart go in and out of rhythm. BP is staying around 170/85 with pulse in the 70's. Pt also states he thinks that it might be coming from Cardizem. Pt states he did not take Cardizem today. Please advise.

## 2013-04-20 NOTE — Telephone Encounter (Signed)
Patient daughter states she has questions about medication Cardizem.  BP yesterday was 180/75.  Patient daughter states needed sooner appt, made on Friday 19th with KL.  Please call back to discuss medication.

## 2013-04-21 NOTE — Telephone Encounter (Signed)
Pt notified to stay on cardizem as he has multiple ED admissions for A-fib with RVR  Agrees with plan,has apt in three days

## 2013-04-21 NOTE — Telephone Encounter (Signed)
Pt has had multiple admissions and ER presentations for a fib. Diltiazem is working to both keep HR under control, and helps to lower BP. I would not recommend stopping this medication, as he would be at risk for reverting back to A Fib with RVR.

## 2013-04-24 ENCOUNTER — Encounter: Payer: Self-pay | Admitting: Adult Health

## 2013-04-24 ENCOUNTER — Ambulatory Visit (INDEPENDENT_AMBULATORY_CARE_PROVIDER_SITE_OTHER): Payer: Medicare Other | Admitting: Adult Health

## 2013-04-24 VITALS — BP 172/70 | HR 72 | Ht 68.0 in | Wt 208.0 lb

## 2013-04-24 DIAGNOSIS — I10 Essential (primary) hypertension: Secondary | ICD-10-CM

## 2013-04-24 DIAGNOSIS — I4891 Unspecified atrial fibrillation: Secondary | ICD-10-CM

## 2013-04-24 MED ORDER — LISINOPRIL 40 MG PO TABS: ORAL_TABLET | ORAL | Status: DC

## 2013-04-24 NOTE — Progress Notes (Signed)
HPI: [name] is a 32 year old patient of Dr. Beulah Gandy were on for ongoing assessment and management of atrial fibrillation flutter, hypertension, with a history of thrombocytopenia and normocytic anemia. The patient was recently hospitalized in November of 2014 in the setting of atrial fibrillation with RVR. She was thought to be likely triggered by pneumonia and elevated blood pressure increasing left atrial pressures.     The patient converted to normal sinus rhythm on amiodarone infusion. Patient had CHADs  score of 1. He was continued on amiodarone for 2 weeks and was asked to stop this medicine. He was sent home on a long 300 mg 2 tablets twice a day, hydralazine 50 mg twice a day, aspirin 325 daily amlodipine, lisinopril and amiodarone as discussed. He is here for posthospitalization followup.   He comes today with complaints of palpitations, he states it usually occurs with dialysis. He has white coat syndrome and is usually hypertensive with clinic visits. He is very nervous today.      Allergies  Allergen Reactions  . Bystolic [Nebivolol Hcl] Palpitations    Current Outpatient Prescriptions  Medication Sig Dispense Refill  . amiodarone (PACERONE) 200 MG tablet Take 1 tablet (200 mg total) by mouth daily.  14 tablet  0  . amLODipine (NORVASC) 10 MG tablet Take 1 tablet (10 mg total) by mouth daily.  30 tablet  0  . aspirin 325 MG tablet Take 1 tablet (325 mg total) by mouth daily.      . hydrALAZINE (APRESOLINE) 50 MG tablet Take 50 mg by mouth 2 (two) times daily.      Marland Kitchen labetalol (NORMODYNE) 300 MG tablet Take 2 tablets (600 mg total) by mouth 2 (two) times daily.  120 tablet  0  . lanthanum (FOSRENOL) 500 MG chewable tablet Chew 1,000 mg by mouth as needed. Take 2 tabs w/ every meal & 1 w/ every snack.      Marland Kitchen lisinopril (PRINIVIL,ZESTRIL) 40 MG tablet Take 40 mg by mouth daily.      Marland Kitchen LORazepam (ATIVAN) 1 MG tablet Take 1 mg by mouth 3 (three) times daily.      . multivitamin  (RENA-VIT) TABS tablet Take 1 tablet by mouth daily.      . Omega-3 Fatty Acids (FISH OIL) 1000 MG CAPS Take 2,000 mg by mouth 2 (two) times daily.      . pravastatin (PRAVACHOL) 40 MG tablet Take 40 mg by mouth daily.      . sodium bicarbonate 650 MG tablet Take 650 mg by mouth 2 (two) times daily.      . Triamcinolone Acetonide (NASACORT ALLERGY 24HR NA) Place 2 sprays into the nose daily.      Marland Kitchen diltiazem (CARDIZEM CD) 120 MG 24 hr capsule Take 1 capsule (120 mg total) by mouth daily.  30 capsule  0   No current facility-administered medications for this visit.    Past Medical History  Diagnosis Date  . Renal disorder   . Blind in both eyes   . Hypertension   . A-fib     Past Surgical History  Procedure Laterality Date  . Eye surgery    . Dialysis fistula creation      ZOX:WRUEAV of systems complete and found to be negative unless listed above  PHYSICAL EXAM BP 172/70  Pulse 72  Ht 5\' 8"  (1.727 m)  Wt 208 lb (94.348 kg)  BMI 31.63 kg/m2  General: Well developed, well nourished, in no acute distress Head: Eyes  PERRLA, legally blind wearing sunglasses, No xanthomas.   Normal cephalic and atramatic  Lungs: Clear bilaterally to auscultation and percussion. Heart: HRRR S1 S2, without MRG.  Pulses are 2+ & equal.            No carotid bruit. No JVD.  No abdominal bruits. No femoral bruits. Abdomen: Bowel sounds are positive, abdomen obese, soft and non-tender without masses or                  Hernia's noted. Msk:  Back normal, normal gait. Normal strength and tone for age. Extremities: No clubbing, cyanosis or edema.  DP +1 Neuro: Alert and oriented X 3. Psych:  Good affect, responds appropriately  :  ASSESSMENT AND PLAN

## 2013-04-24 NOTE — Assessment & Plan Note (Signed)
HR is in NSR and regular, but complains of frequent palpitations, especially during dialysis. I want to place a cardiac monitor on the patient but he refuses. Stating it will be too much for him to wear. I also suggested that we increase the diltiazem dose to 180 mg daily. He refuses this, as he states this causes him to feel bad when he takes the diltiazem.

## 2013-04-24 NOTE — Progress Notes (Deleted)
Name: Justin Ortega    DOB: [birth year]  Age: 32 y.o.  MR#: [id number]       PCP:  Josue Hector, MD      Insurance: Payor: MEDICARE / Plan: MEDICARE PART A AND B / Product Type: *No Product type* /   CC:    Chief Complaint  Patient presents with  . Atrial Fibrillation  . Hypertension    VS Filed Vitals:   04/24/13 1524  BP: 172/70  Pulse: 72  Height: 5\' 8"  (1.727 m)  Weight: 208 lb (94.348 kg)    Weights Current Weight  04/24/13 208 lb (94.348 kg)  04/18/13 200 lb 15 oz (91.145 kg)  04/16/13 217 lb 9.5 oz (98.7 kg)    Blood Pressure  BP Readings from Last 3 Encounters:  04/24/13 172/70  04/18/13 178/82  04/16/13 169/78     Admit date:  (Not on file) Last encounter with RMR:  Visit date not found   Allergy Bystolic  Current Outpatient Prescriptions  Medication Sig Dispense Refill  . amiodarone (PACERONE) 200 MG tablet Take 1 tablet (200 mg total) by mouth daily.  14 tablet  0  . amLODipine (NORVASC) 10 MG tablet Take 1 tablet (10 mg total) by mouth daily.  30 tablet  0  . aspirin 325 MG tablet Take 1 tablet (325 mg total) by mouth daily.      . hydrALAZINE (APRESOLINE) 50 MG tablet Take 50 mg by mouth 2 (two) times daily.      Marland Kitchen labetalol (NORMODYNE) 300 MG tablet Take 2 tablets (600 mg total) by mouth 2 (two) times daily.  120 tablet  0  . lanthanum (FOSRENOL) 500 MG chewable tablet Chew 1,000 mg by mouth as needed. Take 2 tabs w/ every meal & 1 w/ every snack.      Marland Kitchen lisinopril (PRINIVIL,ZESTRIL) 40 MG tablet Take 40 mg by mouth daily.      Marland Kitchen LORazepam (ATIVAN) 1 MG tablet Take 1 mg by mouth 3 (three) times daily.      . multivitamin (RENA-VIT) TABS tablet Take 1 tablet by mouth daily.      . Omega-3 Fatty Acids (FISH OIL) 1000 MG CAPS Take 2,000 mg by mouth 2 (two) times daily.      . pravastatin (PRAVACHOL) 40 MG tablet Take 40 mg by mouth daily.      . sodium bicarbonate 650 MG tablet Take 650 mg by mouth 2 (two) times daily.      . Triamcinolone  Acetonide (NASACORT ALLERGY 24HR NA) Place 2 sprays into the nose daily.      Marland Kitchen diltiazem (CARDIZEM CD) 120 MG 24 hr capsule Take 1 capsule (120 mg total) by mouth daily.  30 capsule  0   No current facility-administered medications for this visit.    Discontinued Meds:   There are no discontinued medications.  Patient Active Problem List   Diagnosis Date Noted  . Atrial fibrillation with RVR 04/01/2013  . Tachycardia 03/31/2013  . HCAP (healthcare-associated pneumonia) 03/27/2013  . ESRD needing dialysis 08/16/2012  . Anemia 08/16/2012  . HTN (hypertension) 08/16/2012  . Diabetes type 2, controlled 08/16/2012    LABS    Component Value Date/Time   NA 139 04/18/2013 1304   NA 137 04/16/2013 1214   NA 140 04/03/2013 0529   K 3.7 04/18/2013 1304   K 3.7 04/16/2013 1214   K 3.8 04/03/2013 0529   CL 98 04/18/2013 1304   CL 95* 04/16/2013 1214   CL  98 04/03/2013 0529   CO2 30 04/18/2013 1304   CO2 29 04/16/2013 1214   CO2 29 04/03/2013 0529   GLUCOSE 143* 04/18/2013 1304   GLUCOSE 142* 04/16/2013 1214   GLUCOSE 134* 04/03/2013 0529   BUN 21 04/18/2013 1304   BUN 29* 04/16/2013 1214   BUN 28* 04/03/2013 0529   CREATININE 5.59* 04/18/2013 1304   CREATININE 5.99* 04/16/2013 1214   CREATININE 9.22* 04/03/2013 0529   CALCIUM 9.5 04/18/2013 1304   CALCIUM 9.4 04/16/2013 1214   CALCIUM 9.3 04/03/2013 0529   GFRNONAA 12* 04/18/2013 1304   GFRNONAA 11* 04/16/2013 1214   GFRNONAA 7* 04/03/2013 0529   GFRAA 14* 04/18/2013 1304   GFRAA 13* 04/16/2013 1214   GFRAA 8* 04/03/2013 0529   CMP     Component Value Date/Time   NA 139 04/18/2013 1304   K 3.7 04/18/2013 1304   CL 98 04/18/2013 1304   CO2 30 04/18/2013 1304   GLUCOSE 143* 04/18/2013 1304   BUN 21 04/18/2013 1304   CREATININE 5.59* 04/18/2013 1304   CALCIUM 9.5 04/18/2013 1304   PROT 6.8 04/18/2013 1304   ALBUMIN 3.6 04/18/2013 1304   AST 14 04/18/2013 1304   ALT 18 04/18/2013 1304   ALKPHOS 92 04/18/2013 1304    BILITOT 0.3 04/18/2013 1304   GFRNONAA 12* 04/18/2013 1304   GFRAA 14* 04/18/2013 1304       Component Value Date/Time   WBC 5.1 04/18/2013 1304   WBC 4.9 04/16/2013 1214   WBC 4.4 04/03/2013 0529   HGB 9.4* 04/18/2013 1304   HGB 9.1* 04/16/2013 1214   HGB 8.3* 04/03/2013 0529   HCT 28.5* 04/18/2013 1304   HCT 27.1* 04/16/2013 1214   HCT 24.2* 04/03/2013 0529   MCV 91.1 04/18/2013 1304   MCV 90.0 04/16/2013 1214   MCV 90.0 04/03/2013 0529    Lipid Panel  No results found for this basename: chol, trig, hdl, cholhdl, vldl, ldlcalc    ABG No results found for this basename: phart, pco2, pco2art, po2, po2art, hco3, tco2, acidbasedef, o2sat     Lab Results  Component Value Date   TSH 0.845 03/29/2013   BNP (last 3 results) No results found for this basename: PROBNP,  in the last 8760 hours Cardiac Panel (last 3 results) No results found for this basename: CKTOTAL, CKMB, TROPONINI, RELINDX,  in the last 72 hours  Iron/TIBC/Ferritin    Component Value Date/Time   IRON 61 08/16/2012 0330   TIBC 283 08/16/2012 0330   FERRITIN 304 08/16/2012 0330     EKG Orders placed during the hospital encounter of 04/18/13  . ED EKG  . ED EKG  . ED EKG  . ED EKG  . EKG 12-LEAD  . EKG 12-LEAD  . EKG     Prior Assessment and Plan Problem List as of 04/24/2013   ESRD needing dialysis   Anemia   HTN (hypertension)   Diabetes type 2, controlled   HCAP (healthcare-associated pneumonia)   Tachycardia   Atrial fibrillation with RVR       Imaging: Dg Chest 2 View  04/18/2013   CLINICAL DATA:  Heart palpitations  EXAM: CHEST  2 VIEW  COMPARISON:  04/16/2013  FINDINGS: The cardiac shadow is again enlarged. The lungs are free of acute infiltrate. Small effusions are noted posteriorly. No acute bony abnormality is seen.  IMPRESSION: Small effusions without focal infiltrate.   Electronically Signed   By: Alcide Clever M.D.   On: 04/18/2013 14:17  Dg Chest 2 View  04/16/2013    CLINICAL DATA:  Atrial fibrillation.  Pneumonia.  EXAM: CHEST  2 VIEW  COMPARISON:  03/31/2013  FINDINGS: Moderate cardiomegaly and pulmonary vascular congestion again noted. No evidence of acute infiltrate or pulmonary edema. No evidence of pleural effusion. No mass or lymphadenopathy identified.  IMPRESSION: Stable cardiomegaly and chronic pulmonary venous hypertension. No acute findings.   Electronically Signed   By: Myles Rosenthal M.D.   On: 04/16/2013 12:20   Dg Chest 2 View  03/29/2013   CLINICAL DATA:  Pneumonia.  EXAM: CHEST  2 VIEW  COMPARISON:  March 27, 2013.  FINDINGS: Stable cardiomegaly. Right lung is clear. Left lower lobe airspace opacity is slightly improved consistent with pneumonia. No pneumothorax or pleural effusion is noted. Bony thorax is intact.  IMPRESSION: Slightly improved left lower lobe pneumonia.   Electronically Signed   By: Roque Lias M.D.   On: 03/29/2013 10:45   Dg Chest 2 View  03/27/2013   CLINICAL DATA:  Cough, shortness of breath  EXAM: CHEST  2 VIEW  COMPARISON:  03/04/2007  FINDINGS: Enlargement of cardiac silhouette.  Mediastinal contours normal.  Mild pulmonary vascular congestion.  Extensive airspace infiltrate identified in both the left upper and left lower lobes most consistent with pneumonia.  Central peribronchial thickening noted.  Right lung grossly clear.  Question component of pleural effusion at lateral left lung base.  No pneumothorax or acute osseous findings.  IMPRESSION: Enlargement of cardiac silhouette.  Extensive infiltrate in left upper and left lower lobes most consistent with pneumonia.  Question small left pleural effusion.   Electronically Signed   By: Ulyses Southward M.D.   On: 03/27/2013 10:52   Dg Chest Portable 1 View  03/31/2013   CLINICAL DATA:  Tachycardia and increased blood pressure tonight. Hospitalized since Friday for pneumonia and discharge today.  EXAM: PORTABLE CHEST - 1 VIEW  COMPARISON:  03/29/2013  FINDINGS: Cardiac  enlargement with prominent pulmonary vascularity suggesting mild congestion. Infiltration in the left lung base appears stable since previous study and is consistent with pneumonia. No blunting of costophrenic angles. No pneumothorax.  IMPRESSION: Cardiac enlargement with mild pulmonary vascular congestion. No edema. Left lower lung infiltration consistent with pneumonia appear stable.   Electronically Signed   By: Burman Nieves M.D.   On: 03/31/2013 22:44

## 2013-04-24 NOTE — Patient Instructions (Signed)
Your physician recommends that you schedule a follow-up appointment in: 1 month with Dr Purvis Sheffield   Your physician has recommended you make the following change in your medication:  1. Star Lisinopril take 40 mg in the morning and 20 mg in the evening.

## 2013-04-24 NOTE — Assessment & Plan Note (Signed)
He remains hypertensive on BP recheck after resting and assessment. I will increase lisinopril to 40 mg in am and 20 mg in pm. He will follow up in one month with Dr. Purvis Sheffield for ongoing assessment.

## 2013-05-01 ENCOUNTER — Encounter: Payer: Medicare Other | Admitting: Cardiovascular Disease

## 2013-05-27 ENCOUNTER — Ambulatory Visit (INDEPENDENT_AMBULATORY_CARE_PROVIDER_SITE_OTHER): Payer: Medicare Other | Admitting: Cardiovascular Disease

## 2013-05-27 ENCOUNTER — Encounter: Payer: Self-pay | Admitting: Cardiovascular Disease

## 2013-05-27 VITALS — BP 173/76 | HR 67 | Ht 69.0 in | Wt 222.0 lb

## 2013-05-27 DIAGNOSIS — Z992 Dependence on renal dialysis: Secondary | ICD-10-CM

## 2013-05-27 DIAGNOSIS — I10 Essential (primary) hypertension: Secondary | ICD-10-CM

## 2013-05-27 DIAGNOSIS — I4891 Unspecified atrial fibrillation: Secondary | ICD-10-CM

## 2013-05-27 DIAGNOSIS — I480 Paroxysmal atrial fibrillation: Secondary | ICD-10-CM

## 2013-05-27 DIAGNOSIS — N186 End stage renal disease: Secondary | ICD-10-CM

## 2013-05-27 MED ORDER — DILTIAZEM HCL 30 MG PO TABS: 30.0000 mg | ORAL_TABLET | ORAL | Status: AC | PRN

## 2013-05-27 NOTE — Patient Instructions (Addendum)
Your physician wants you to follow-up in: 6 months You will receive a reminder letter in the mail two months in advance. If you don't receive a letter, please call our office to schedule the follow-up appointment.  Your physician has recommended you make the following change in your medication:   1) START TAKING DILTIAZEM 30MG  AS NEEDED FOR PALPITATIONS 2) STOP TAKING ASPIRIN 3) STOP TAKING AMIODARONE 4) STOP TAKING EVENING DOSAGE OF LISINOPRIL, ONLY TAKE LISINOPRIL  40MG  ONCE DAILY IN THE AM

## 2013-05-27 NOTE — Progress Notes (Signed)
Patient ID: Justin Ortega, male   DOB: March 15, 1981, 33 y.o.   MRN: [id number]      SUBJECTIVE: The patient is here for routine cardiovascular followup for his atrial fibrillation/flutter as well as stage II hypertension. He has been doing very well and has only had palpitations on one occasion, relieved by performing a Valsalva maneuver. His blood pressure is regularly checked at home and it stays in the 130-140/70 range. He's noted to have white coat hypertension. When he is on dialysis his blood pressures have also remained normal, and his nephrologist (Dr. Kristian CoveyBefekadu) has been pleased with his blood pressures as per the patient. He has continued to take amiodarone and is not taking any diltiazem.    Allergies  Allergen Reactions  . Bystolic [Nebivolol Hcl] Palpitations    Current Outpatient Prescriptions  Medication Sig Dispense Refill  . amiodarone (PACERONE) 200 MG tablet Take 1 tablet (200 mg total) by mouth daily.  14 tablet  0  . amLODipine (NORVASC) 10 MG tablet Take 1 tablet (10 mg total) by mouth daily.  30 tablet  0  . aspirin 325 MG tablet Take 1 tablet (325 mg total) by mouth daily.      Marland Kitchen. diltiazem (CARDIZEM CD) 120 MG 24 hr capsule Take 1 capsule (120 mg total) by mouth daily.  30 capsule  0  . hydrALAZINE (APRESOLINE) 50 MG tablet Take 50 mg by mouth 2 (two) times daily.      Marland Kitchen. labetalol (NORMODYNE) 300 MG tablet Take 2 tablets (600 mg total) by mouth 2 (two) times daily.  120 tablet  0  . lanthanum (FOSRENOL) 500 MG chewable tablet Chew 1,000 mg by mouth as needed. Take 2 tabs w/ every meal & 1 w/ every snack.      Marland Kitchen. lisinopril (PRINIVIL,ZESTRIL) 40 MG tablet Take 40 mg in the morning and 20 mg in the evening.  60 tablet  6  . LORazepam (ATIVAN) 1 MG tablet Take 1 mg by mouth 3 (three) times daily.      . multivitamin (RENA-VIT) TABS tablet Take 1 tablet by mouth daily.      . Omega-3 Fatty Acids (FISH OIL) 1000 MG CAPS Take 2,000 mg by mouth 2 (two) times daily.      .  pravastatin (PRAVACHOL) 40 MG tablet Take 40 mg by mouth daily.      . sodium bicarbonate 650 MG tablet Take 650 mg by mouth 2 (two) times daily.      . Triamcinolone Acetonide (NASACORT ALLERGY 24HR NA) Place 2 sprays into the nose daily.       No current facility-administered medications for this visit.    Past Medical History  Diagnosis Date  . Renal disorder   . Blind in both eyes   . Hypertension   . A-fib     Past Surgical History  Procedure Laterality Date  . Eye surgery    . Dialysis fistula creation      History   Social History  . Marital Status: Single    Spouse Name: N/A    Number of Children: N/A  . Years of Education: N/A   Occupational History  . Not on file.   Social History Main Topics  . Smoking status: Current Every Day Smoker -- 0.25 packs/day    Types: Cigarettes  . Smokeless tobacco: Not on file  . Alcohol Use: No  . Drug Use: No  . Sexual Activity: Not on file   Other Topics Concern  .  Not on file   Social History Narrative  . No narrative on file     Filed Vitals:   05/27/13 1044  BP: 173/76  Pulse: 67  Height: 5\' 9"  (1.753 m)  Weight: 222 lb (100.699 kg)    PHYSICAL EXAM General: NAD Neck: No JVD, no thyromegaly or thyroid nodule.  Lungs: Clear to auscultation bilaterally with normal respiratory effort. CV: Nondisplaced PMI.  Heart regular S1/S2, no S3/S4, no murmur.  No peripheral edema.  No carotid bruit.  Normal pedal pulses.  Abdomen: Soft, nontender, no hepatosplenomegaly, no distention.  Neurologic: Alert and oriented x 3.  Psych: Normal affect. Extremities: No clubbing or cyanosis.   ECG: reviewed and available in electronic records.      ASSESSMENT AND PLAN: 1. Atrial fibrillation/flutter: given the possibility of side effects with chronic use, I will d/c amiodarone (he was actually only supposed to have taken it for 2 weeks following hospital discharge). I will prescribe diltiazem 30 mg to be taken on a prn  basis for palpitations. Will continue current dose of labetalol 600 mg bid. Resting HR is 67 bpm today. Given his low CHADS-Vasc score (1), anticoagulation is not indicated. Will d/c ASA as he does not require antiplatelet therapy for stroke prevention. 2. HTN: appears to be well controlled at home and prior to dialysis. I will discontinue the 20 mg of lisinopril he was instructed to take in the evenings, as maximum benefit is typically seen with 40 mg daily. Continue amlodipine 10 mg daily and hydralazine 50 mg bid along with labetalol. 3. ESRD: he is will soon be undergoing a renal transplant workup.  Dispo: f/u 6 months.  Prentice Docker, M.D., F.A.C.C.

## 2013-12-21 IMAGING — CR DG CHEST 1V PORT
1 series · 1 of 1 positions shown · non-contrast
Comparison: 03/29/2013

CLINICAL DATA: Tachycardia and increased blood pressure tonight.
Hospitalized since [REDACTED] for pneumonia and discharge today.

EXAM:
PORTABLE CHEST - 1 VIEW

[portable]
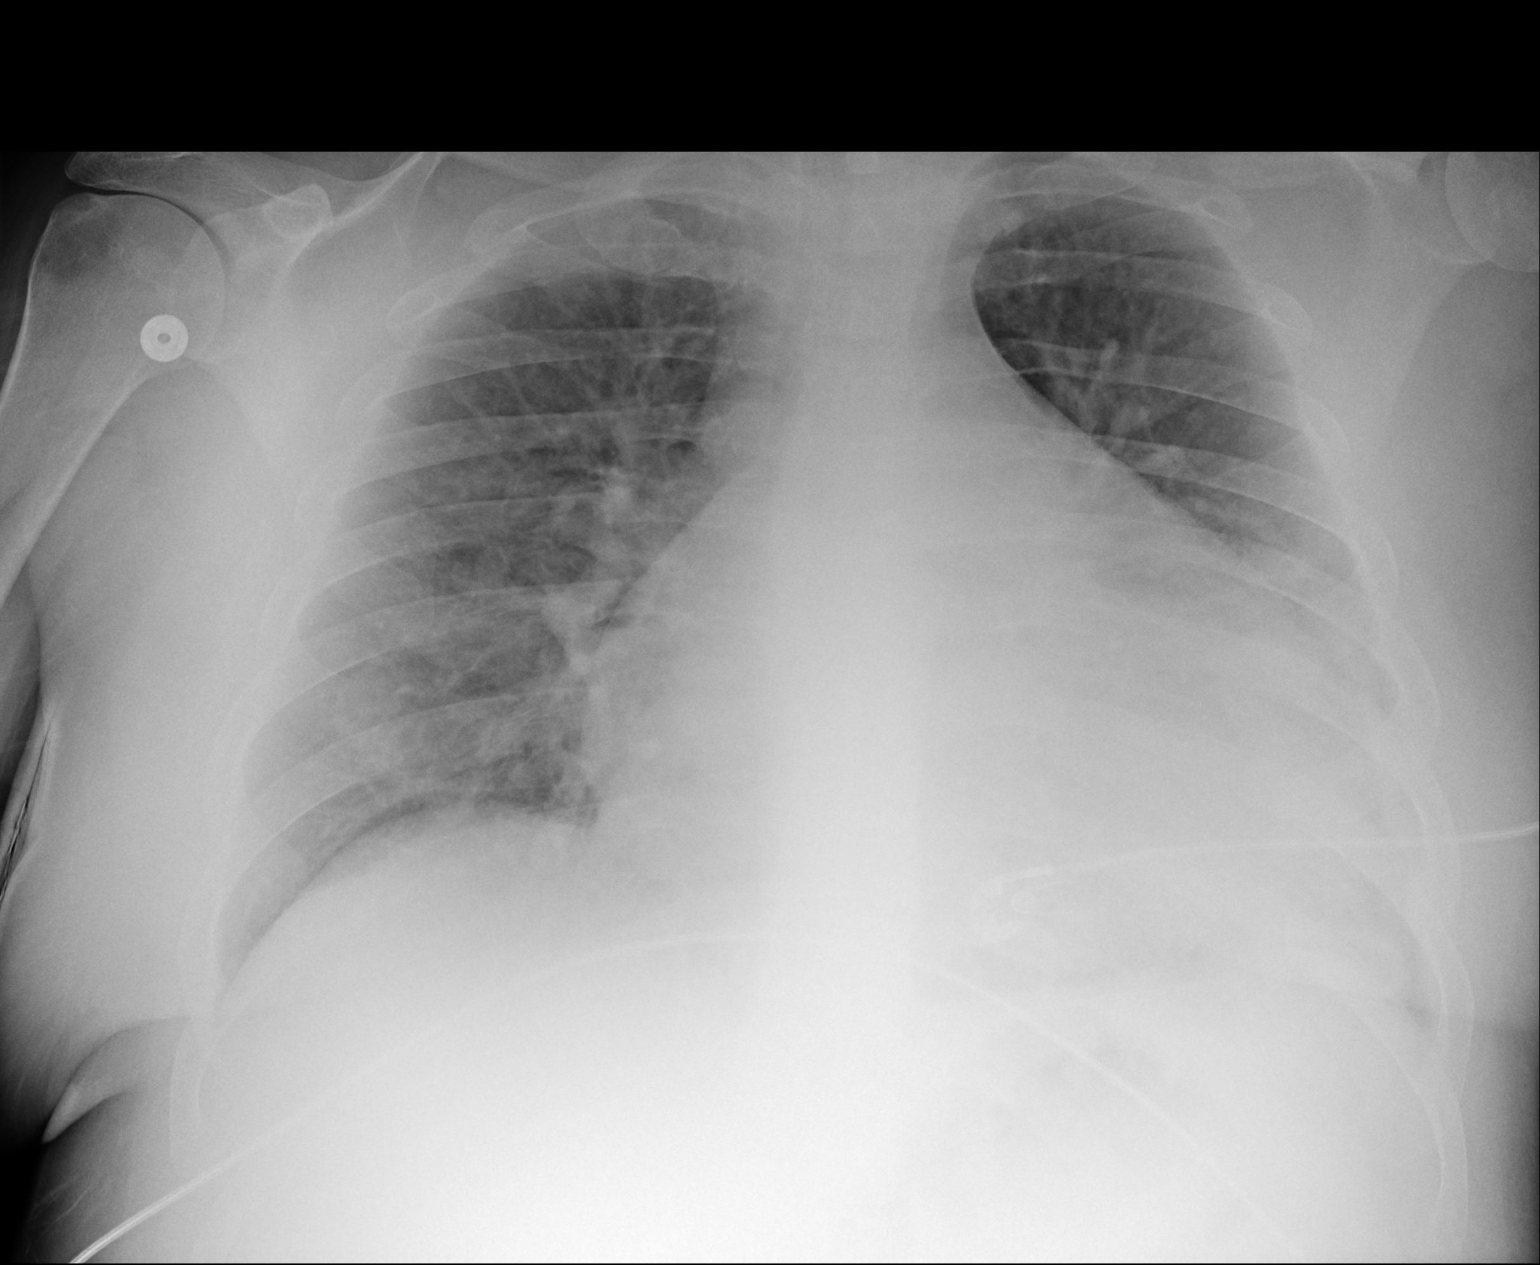

[1 of 1 positions shown; findings below may reference images not displayed]

FINDINGS: Cardiac enlargement with prominent pulmonary vascularity suggesting
mild congestion. Infiltration in the left lung base appears stable
since previous study and is consistent with pneumonia. No blunting
of costophrenic angles. No pneumothorax.
IMPRESSION: Cardiac enlargement with mild pulmonary vascular congestion. No
edema. Left lower lung infiltration consistent with pneumonia appear
stable.

## 2014-01-06 IMAGING — CR DG CHEST 2V
2 series · 2 of 2 positions shown · non-contrast
Comparison: 03/31/2013

CLINICAL DATA: Atrial fibrillation.  Pneumonia.

EXAM:
CHEST  2 VIEW

[view not recorded (1 of 2)]
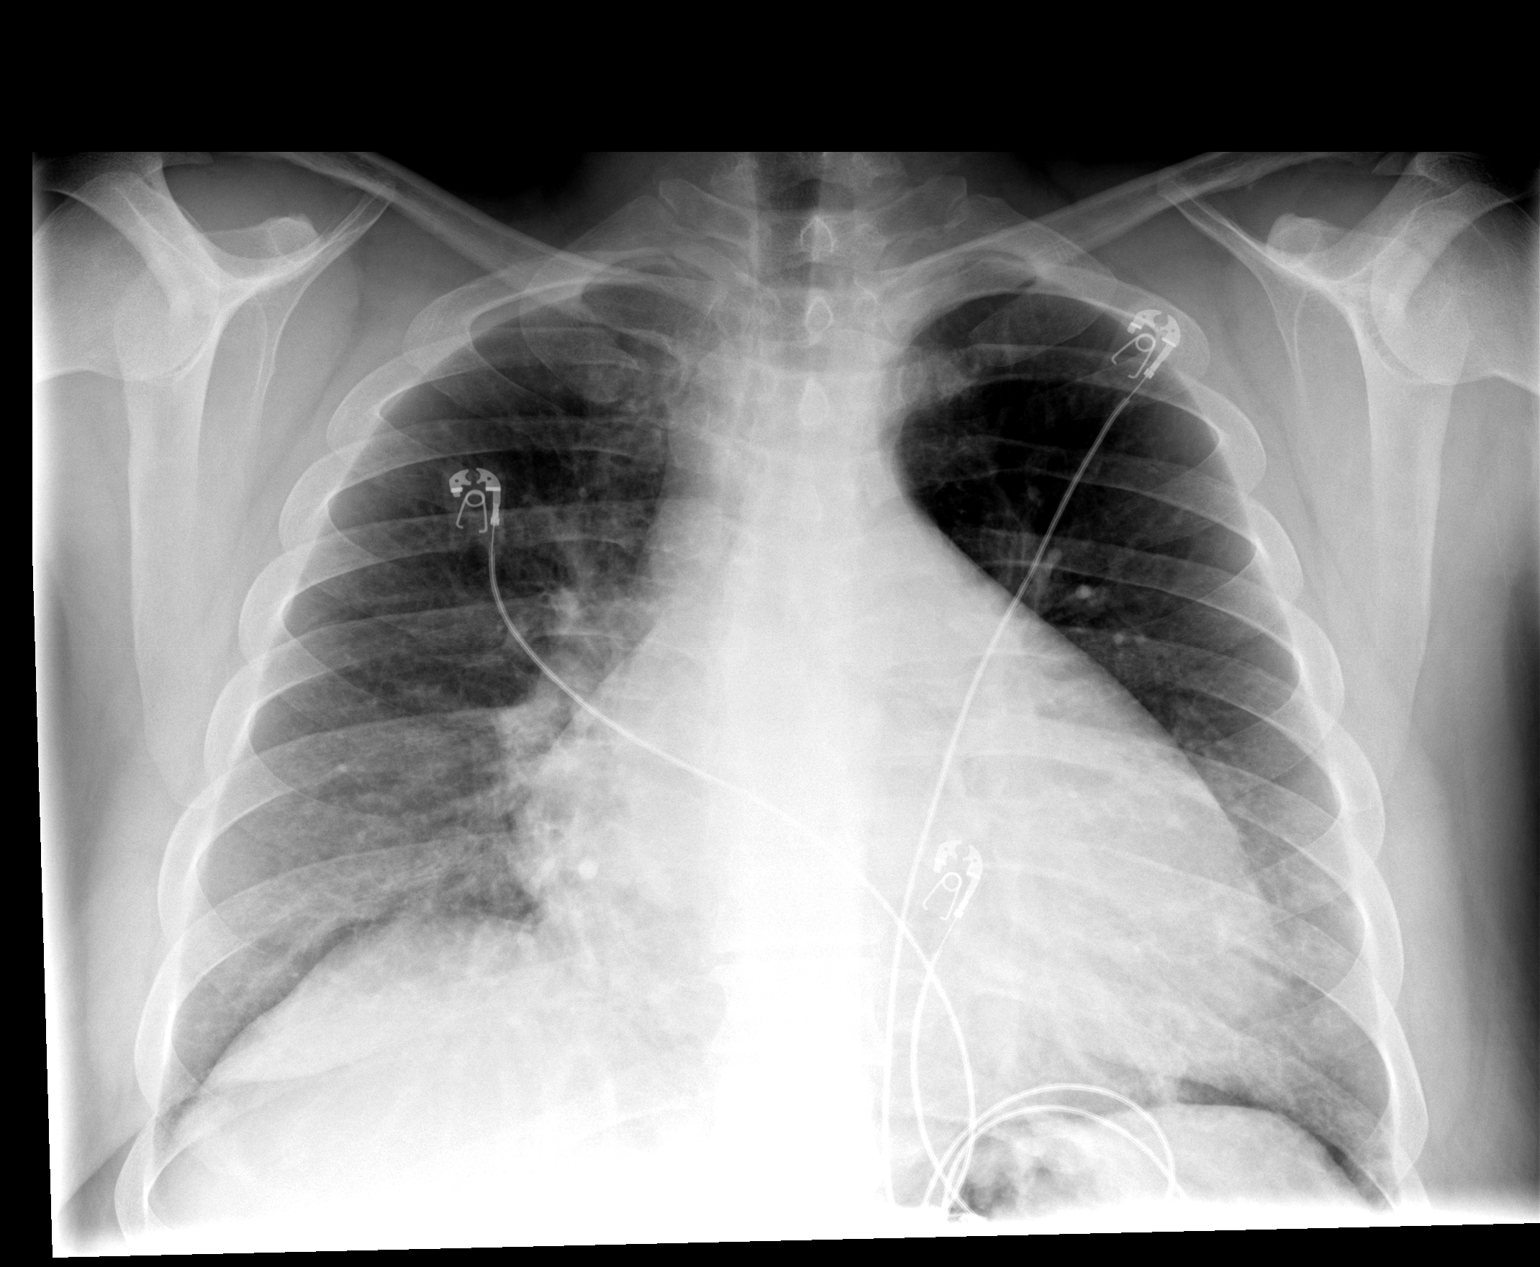

[view not recorded (2 of 2)]
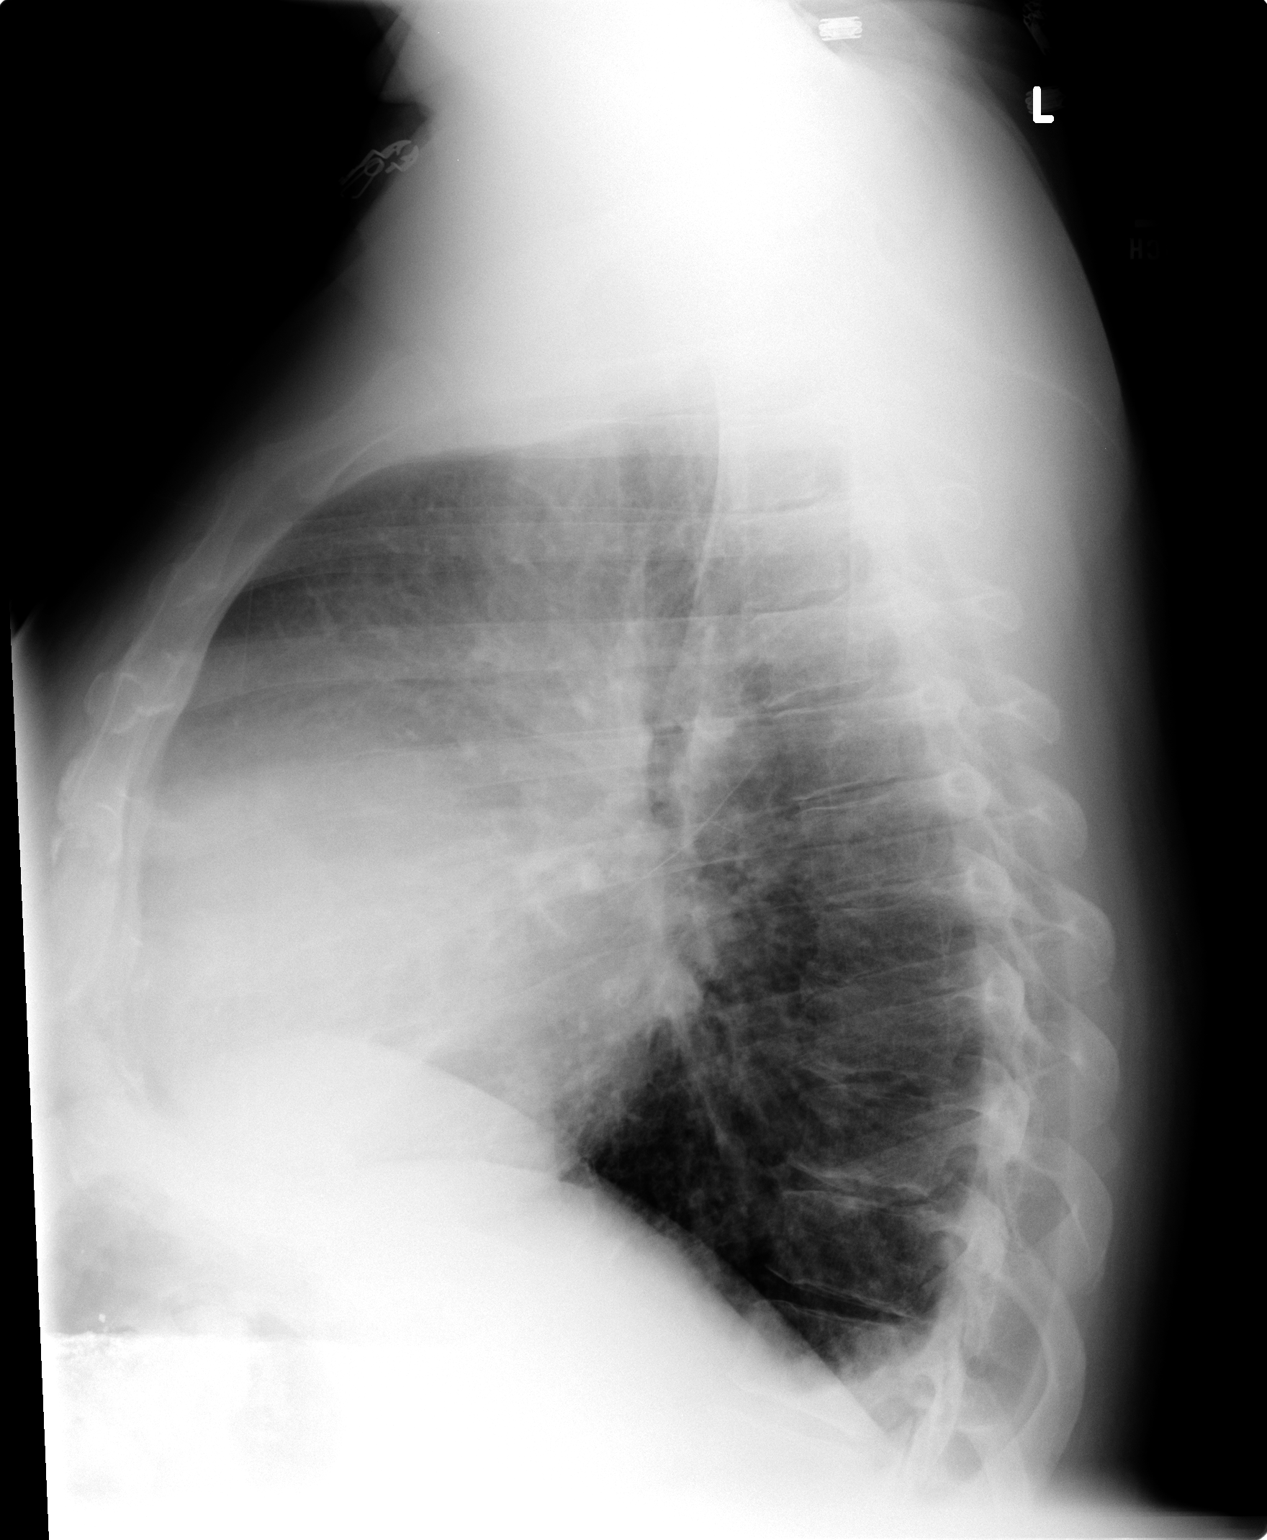

[2 of 2 positions shown; findings below may reference images not displayed]

FINDINGS: Moderate cardiomegaly and pulmonary vascular congestion again noted.
No evidence of acute infiltrate or pulmonary edema. No evidence of
pleural effusion. No mass or lymphadenopathy identified.
IMPRESSION: Stable cardiomegaly and chronic pulmonary venous hypertension. No
acute findings.
